# Patient Record
Sex: Female | Born: 1945 | Race: White | Hispanic: No | Marital: Married | Smoking: Never smoker
Health system: Southern US, Community
[De-identification: ages and names within clinical notes are randomized; demographics above are authoritative.]

## PROBLEM LIST (undated history)

## (undated) DIAGNOSIS — C801 Malignant (primary) neoplasm, unspecified: Secondary | ICD-10-CM

## (undated) DIAGNOSIS — J189 Pneumonia, unspecified organism: Secondary | ICD-10-CM

## (undated) DIAGNOSIS — J45909 Unspecified asthma, uncomplicated: Secondary | ICD-10-CM

## (undated) DIAGNOSIS — Z86718 Personal history of other venous thrombosis and embolism: Secondary | ICD-10-CM

## (undated) DIAGNOSIS — K219 Gastro-esophageal reflux disease without esophagitis: Secondary | ICD-10-CM

## (undated) DIAGNOSIS — T782XXA Anaphylactic shock, unspecified, initial encounter: Secondary | ICD-10-CM

## (undated) DIAGNOSIS — D649 Anemia, unspecified: Secondary | ICD-10-CM

## (undated) DIAGNOSIS — J301 Allergic rhinitis due to pollen: Secondary | ICD-10-CM

## (undated) DIAGNOSIS — H9313 Tinnitus, bilateral: Secondary | ICD-10-CM

## (undated) DIAGNOSIS — T8859XA Other complications of anesthesia, initial encounter: Secondary | ICD-10-CM

## (undated) DIAGNOSIS — N390 Urinary tract infection, site not specified: Secondary | ICD-10-CM

## (undated) HISTORY — PX: PORTACATH PLACEMENT: SHX2246

## (undated) HISTORY — PX: WISDOM TOOTH EXTRACTION: SHX21

## (undated) HISTORY — PX: OTHER SURGICAL HISTORY: SHX169

---

## 2020-04-12 DIAGNOSIS — U071 COVID-19: Secondary | ICD-10-CM

## 2020-04-12 HISTORY — DX: COVID-19: U07.1

## 2020-06-05 ENCOUNTER — Telehealth: Payer: Self-pay | Admitting: Oncology

## 2020-06-05 NOTE — Telephone Encounter (Signed)
Received a new pt referral from [facility] for bladder cancer. Alexandra Little has been cld and scheduled to see Dr. Alen Blew on 3/3 at 2pm. She's aware to arrive 30 minutes early. Pt was unable to provide her demographic information at the time of our phone call.

## 2020-06-11 ENCOUNTER — Ambulatory Visit: Payer: Self-pay | Admitting: Oncology

## 2020-06-12 ENCOUNTER — Telehealth: Payer: Self-pay | Admitting: Oncology

## 2020-06-12 ENCOUNTER — Inpatient Hospital Stay: Payer: Medicare Other | Attending: Oncology | Admitting: Oncology

## 2020-06-12 ENCOUNTER — Inpatient Hospital Stay (HOSPITAL_BASED_OUTPATIENT_CLINIC_OR_DEPARTMENT_OTHER): Payer: Medicare Other | Admitting: Oncology

## 2020-06-12 ENCOUNTER — Other Ambulatory Visit: Payer: Self-pay

## 2020-06-12 ENCOUNTER — Encounter: Payer: Self-pay | Admitting: Oncology

## 2020-06-12 VITALS — BP 113/63 | HR 83 | Temp 98.2°F | Resp 18

## 2020-06-12 DIAGNOSIS — Z7189 Other specified counseling: Secondary | ICD-10-CM

## 2020-06-12 DIAGNOSIS — Z7901 Long term (current) use of anticoagulants: Secondary | ICD-10-CM

## 2020-06-12 DIAGNOSIS — Z5112 Encounter for antineoplastic immunotherapy: Secondary | ICD-10-CM | POA: Insufficient documentation

## 2020-06-12 DIAGNOSIS — C679 Malignant neoplasm of bladder, unspecified: Secondary | ICD-10-CM | POA: Diagnosis not present

## 2020-06-12 DIAGNOSIS — Z86718 Personal history of other venous thrombosis and embolism: Secondary | ICD-10-CM

## 2020-06-12 DIAGNOSIS — Z79899 Other long term (current) drug therapy: Secondary | ICD-10-CM | POA: Insufficient documentation

## 2020-06-12 MED ORDER — LIDOCAINE-PRILOCAINE 2.5-2.5 % EX CREA: 1.0000 "application " | TOPICAL_CREAM | CUTANEOUS | 0 refills | Status: DC | PRN

## 2020-06-12 MED ORDER — PROCHLORPERAZINE MALEATE 10 MG PO TABS: 10.0000 mg | ORAL_TABLET | Freq: Four times a day (QID) | ORAL | 0 refills | Status: DC | PRN

## 2020-06-12 NOTE — Progress Notes (Signed)
Reason for the request:   Bladder cancer  HPI: I was asked by Dr. Dolores Patty  to evaluate Ms. Raatz with diagnosis of bladder cancer.  She is a 75 year old woman diagnosed with bladder cancer in October 2020.  At that time she presented with obstructive uropathy and pelvic adenopathy indicating advanced disease.  She had gross hematuria and a cystoscopy and a TURBT confirmed the diagnosis of urothelial carcinoma.  She underwent percutaneous nephrostomy tubes but continues to have borderline kidney function.  She was treated with carboplatin and gemcitabine with the last infusion in March 2021.  She had severe cytopenias after 5 cycles of therapy and was switched to Avelumab in April 2021.  In July 2021 showed worsening of disease occluding retroperitoneal and inguinal adenopathy.  He subsequently was switched to [facility] in September 2021 therapy was poorly tolerated at day 1, 8 and 15 at 28-day cycle.  He subsequently was switched to 1 mg/kg day 1 day 15 out of 28-day cycle.  She tolerated therapy very well since that time and the last infusion was given in February 2022.  CT scan in February 2022 showed a near complete response to therapy.  She relocated from [place] where she was getting her treatment currently living in a senior living facility with her husband with her close by.  Clinically, she reports feeling well without any complaints at this time.  She denies any nausea, vomiting or abdominal pain.  She denies any worsening neuropathy.   She does not report any headaches, blurry vision, syncope or seizures. Does not report any fevers, chills or sweats.  Does not report any cough, wheezing or hemoptysis.  Does not report any chest pain, palpitation, orthopnea or leg edema.  Does not report any nausea, vomiting or abdominal pain.  Does not report any constipation or diarrhea.  Does not report any skeletal complaints.    Does not report frequency, urgency or hematuria.  Does not report any skin rashes  or lesions. Does not report any heat or cold intolerance.  Does not report any lymphadenopathy or petechiae.  Does not report any anxiety or depression.  Remaining review of systems is negative.    Past medical history significant for deep vein thrombosis currently on Eliquis.    Medications including Eliquis.    No family history of malignancy.  Social History   Socioeconomic History  . Marital status: Not on file    Spouse name: Not on file  . Number of children: Not on file  . Years of education: Not on file  . Highest education level: Not on file  Occupational History  . Not on file  Tobacco Use  . Smoking status: Not on file  . Smokeless tobacco: Not on file  Substance and Sexual Activity  . Alcohol use: Not on file  . Drug use: Not on file  . Sexual activity: Not on file  Other Topics Concern  . Not on file  Social History Narrative  . Not on file   Social Determinants of Health   Financial Resource Strain: Not on file  Food Insecurity: Not on file  Transportation Needs: Not on file  Physical Activity: Not on file  Stress: Not on file  Social Connections: Not on file  Intimate Partner Violence: Not on file  :  Pertinent items are noted in HPI.  Exam: Blood pressure 113/63, pulse 83, temperature 98.2 F (36.8 C), temperature source Oral, resp. rate 18, SpO2 100 %.  ECOG one  General appearance: alert  and cooperative appeared without distress. Head: atraumatic without any abnormalities. Eyes: conjunctivae/corneas clear. PERRL.  Sclera anicteric. Throat: lips, mucosa, and tongue normal; without oral thrush or ulcers. Resp: clear to auscultation bilaterally without rhonchi, wheezes or dullness to percussion. Cardio: regular rate and rhythm, S1, S2 normal, no murmur, click, rub or gallop GI: soft, non-tender; bowel sounds normal; no masses,  no organomegaly Skin: Skin color, texture, turgor normal. No rashes or lesions Lymph nodes: Cervical,  supraclavicular, and axillary nodes normal. Neurologic: Grossly normal without any motor, sensory or deep tendon reflexes. Musculoskeletal: No joint deformity or effusion.    Assessment and Plan:    75 year old woman with:  1.  Advanced bladder cancer diagnosed in December 2020.  She presented with hematuria and found to have pelvic adenopathy and obstructive uropathy.  She was treated with carboplatin and gemcitabine with reasonable response but severe cytopenia.  She was switched to Avelumab with progression of disease in September 2021.  She has been on Padcev at this time with better tolerance at 1 mg/kg day 1, 8  of a 28-day cycle.  She is relocated from [place] to be close with her daughter and here to establish care.  The natural course of her disease was reviewed today and treatment options were discussed.  Given her excellent response to Padcevl and overall excellent tolerance I have recommended continuing with this plan.  The plan is to proceed with Padcev 1 mg/kg day every other week out of a 28-day cycle.  Complication associated with this treatment were reiterated including nausea, vomiting, myelosuppression, neuropathy and hyperglycemia.  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  2.  IV access: Port-A-Cath in place and will obtain a chest x-ray to assess position.  3  Antiemetics: Prescription for Compazine will be available to her.  4.  Prognosis and goals of care: Treatment is palliative but her formal status is reasonable and aggressive measures are warranted.  5.  Follow-up: Will be in the near future to start therapy.   60  minutes were dedicated to this visit. The time was spent on reviewing laboratory data, imaging studies, discussing treatment options, and answering questions regarding future plan.     A copy of this consult has been forwarded to the requesting physician.

## 2020-06-12 NOTE — Telephone Encounter (Signed)
Ms. Peterkin no showed for her appt today w/Dr. Alen Blew. I rescheduled the pt to see Dr. Alen Blew on 3/15 at 11am. I cld and lft the appt date and time on her vm.

## 2020-06-12 NOTE — Progress Notes (Unsigned)
  Reason for the request:    Bladder cancer  HPI: I was asked by Dr. Dolores Patty to evaluate Alexandra Little for the evaluation of a bladder cancer.   does not report any headaches, blurry vision, syncope or seizures. Does not report any fevers, chills or sweats.  Does not report any cough, wheezing or hemoptysis.  Does not report any chest pain, palpitation, orthopnea or leg edema.  Does not report any nausea, vomiting or abdominal pain.  Does not report any constipation or diarrhea.  Does not report any skeletal complaints.    Does not report frequency, urgency or hematuria.  Does not report any skin rashes or lesions. Does not report any heat or cold intolerance.  Does not report any lymphadenopathy or petechiae.  Does not report any anxiety or depression.  Remaining review of systems is negative.    No past medical history on file.:  *** The histories are not reviewed yet. Please review them in the "History" navigator section and refresh this SmartLink.:  No current outpatient medications on file.:  Not on File:  No family history on file.:  Social History   Socioeconomic History  . Marital status: Not on file    Spouse name: Not on file  . Number of children: Not on file  . Years of education: Not on file  . Highest education level: Not on file  Occupational History  . Not on file  Tobacco Use  . Smoking status: Not on file  . Smokeless tobacco: Not on file  Substance and Sexual Activity  . Alcohol use: Not on file  . Drug use: Not on file  . Sexual activity: Not on file  Other Topics Concern  . Not on file  Social History Narrative  . Not on file   Social Determinants of Health   Financial Resource Strain: Not on file  Food Insecurity: Not on file  Transportation Needs: Not on file  Physical Activity: Not on file  Stress: Not on file  Social Connections: Not on file  Intimate Partner Violence: Not on file  :  Pertinent items are noted in HPI.  Exam: There were no  vitals taken for this visit. General appearance: alert and cooperative appeared without distress. Head: atraumatic without any abnormalities. Eyes: conjunctivae/corneas clear. PERRL.  Sclera anicteric. Throat: lips, mucosa, and tongue normal; without oral thrush or ulcers. Resp: clear to auscultation bilaterally without rhonchi, wheezes or dullness to percussion. Cardio: regular rate and rhythm, S1, S2 normal, no murmur, click, rub or gallop GI: soft, non-tender; bowel sounds normal; no masses,  no organomegaly Skin: Skin color, texture, turgor normal. No rashes or lesions Lymph nodes: Cervical, supraclavicular, and axillary nodes normal. Neurologic: Grossly normal without any motor, sensory or deep tendon reflexes. Musculoskeletal: No joint deformity or effusion.  No results for input(s): WBC, HGB, HCT, PLT in the last 72 hours. No results for input(s): NA, K, CL, CO2, GLUCOSE, BUN, CREATININE, CALCIUM in the last 72 hours.   Blood smear review: ***  Pathology:***  No results found.  Assessment and Plan:       Thank you for the referral.  I had the pleasure of meeting this patient today.  A copy of this consult has been forwarded to the requesting physician.

## 2020-06-12 NOTE — Progress Notes (Signed)
START ON PATHWAY REGIMEN - Bladder     A cycle is every 28 days:     Enfortumab vedotin-ejfv   **Always confirm dose/schedule in your pharmacy ordering system**  Patient Characteristics: Advanced/Metastatic Disease, Third Line and Beyond Therapeutic Status: Advanced/Metastatic Disease Line of Therapy: Third Line and Beyond  Intent of Therapy: Non-Curative / Palliative Intent, Discussed with Patient

## 2020-06-12 NOTE — Telephone Encounter (Signed)
Pt came to today's appt late after it was marked as a no show. I re-entered her appt at 3pm because I was unable to re-enter in the 2pm time slot. I notified Dr. Hazeline Junker nurse.

## 2020-06-13 ENCOUNTER — Telehealth: Payer: Self-pay | Admitting: Oncology

## 2020-06-13 NOTE — Telephone Encounter (Signed)
Scheduled per 03/03 los, patient has been called and voicemail was left. ?

## 2020-06-18 ENCOUNTER — Inpatient Hospital Stay: Payer: Medicare Other

## 2020-06-19 ENCOUNTER — Other Ambulatory Visit: Payer: Self-pay | Admitting: Urology

## 2020-06-19 ENCOUNTER — Other Ambulatory Visit: Payer: Self-pay | Admitting: Oncology

## 2020-06-19 ENCOUNTER — Other Ambulatory Visit: Payer: Self-pay

## 2020-06-19 ENCOUNTER — Inpatient Hospital Stay: Payer: Medicare Other

## 2020-06-19 ENCOUNTER — Telehealth: Payer: Self-pay

## 2020-06-19 ENCOUNTER — Ambulatory Visit (HOSPITAL_BASED_OUTPATIENT_CLINIC_OR_DEPARTMENT_OTHER)
Admission: RE | Admit: 2020-06-19 | Discharge: 2020-06-19 | Disposition: A | Discharge status: Home / Self Care | Payer: Medicare Other | Source: Ambulatory Visit | Attending: Oncology | Admitting: Oncology

## 2020-06-19 VITALS — BP 137/96 | HR 99 | Temp 97.8°F | Resp 18 | Wt 170.5 lb

## 2020-06-19 DIAGNOSIS — C679 Malignant neoplasm of bladder, unspecified: Secondary | ICD-10-CM

## 2020-06-19 DIAGNOSIS — Z79899 Other long term (current) drug therapy: Secondary | ICD-10-CM | POA: Diagnosis not present

## 2020-06-19 DIAGNOSIS — Z5112 Encounter for antineoplastic immunotherapy: Secondary | ICD-10-CM | POA: Diagnosis not present

## 2020-06-19 DIAGNOSIS — Z95828 Presence of other vascular implants and grafts: Secondary | ICD-10-CM | POA: Insufficient documentation

## 2020-06-19 LAB — CMP (CANCER CENTER ONLY)
ALT: <6 U/L (ref 0–44)
AST: 14 U/L — ABNORMAL LOW (ref 15–41)
Albumin: 2.8 g/dL — ABNORMAL LOW (ref 3.5–5.0)
Alkaline Phosphatase: 113 U/L (ref 38–126)
Anion gap: 9 (ref 5–15)
BUN: 34 mg/dL — ABNORMAL HIGH (ref 8–23)
CO2: 20 mmol/L — ABNORMAL LOW (ref 22–32)
Calcium: 9.5 mg/dL (ref 8.9–10.3)
Chloride: 111 mmol/L (ref 98–111)
Creatinine: 3.49 mg/dL (ref 0.44–1.00)
GFR, Estimated: 13 mL/min — ABNORMAL LOW (ref >60)
Glucose, Bld: 102 mg/dL — ABNORMAL HIGH (ref 70–99)
Potassium: 5.1 mmol/L (ref 3.5–5.1)
Sodium: 140 mmol/L (ref 135–145)
Total Bilirubin: 0.4 mg/dL (ref 0.3–1.2)
Total Protein: 7.9 g/dL (ref 6.5–8.1)

## 2020-06-19 LAB — CBC WITH DIFFERENTIAL (CANCER CENTER ONLY)
Abs Immature Granulocytes: 0.12 10*3/uL — ABNORMAL HIGH (ref 0.00–0.07)
Basophils Absolute: 0.1 10*3/uL (ref 0.0–0.1)
Basophils Relative: 1 %
Eosinophils Absolute: 0.4 10*3/uL (ref 0.0–0.5)
Eosinophils Relative: 5 %
HCT: 34.9 % — ABNORMAL LOW (ref 36.0–46.0)
Hemoglobin: 11.2 g/dL — ABNORMAL LOW (ref 12.0–15.0)
Immature Granulocytes: 1 %
Lymphocytes Relative: 15 %
Lymphs Abs: 1.3 10*3/uL (ref 0.7–4.0)
MCH: 29.1 pg (ref 26.0–34.0)
MCHC: 32.1 g/dL (ref 30.0–36.0)
MCV: 90.6 fL (ref 80.0–100.0)
Monocytes Absolute: 0.8 10*3/uL (ref 0.1–1.0)
Monocytes Relative: 9 %
Neutro Abs: 6.2 10*3/uL (ref 1.7–7.7)
Neutrophils Relative %: 69 %
Platelet Count: 285 10*3/uL (ref 150–400)
RBC: 3.85 MIL/uL — ABNORMAL LOW (ref 3.87–5.11)
RDW: 14.5 % (ref 11.5–15.5)
WBC Count: 8.9 10*3/uL (ref 4.0–10.5)
nRBC: 0 % (ref 0.0–0.2)

## 2020-06-19 MED ORDER — SODIUM CHLORIDE 0.9% FLUSH
10.0000 mL | Freq: Once | INTRAVENOUS | Status: AC
  Administered 2020-06-19: 10 mL
  Filled 2020-06-19: qty 10

## 2020-06-19 MED ORDER — SODIUM CHLORIDE 0.9 % IV SOLN
10.0000 mg | Freq: Once | INTRAVENOUS | Status: AC
  Administered 2020-06-19: 10 mg via INTRAVENOUS
  Filled 2020-06-19: qty 10

## 2020-06-19 MED ORDER — PALONOSETRON HCL INJECTION 0.25 MG/5ML
0.2500 mg | Freq: Once | INTRAVENOUS | Status: AC
  Administered 2020-06-19: 0.25 mg via INTRAVENOUS

## 2020-06-19 MED ORDER — SODIUM CHLORIDE 0.9 % IV SOLN
1.0000 mg/kg | Freq: Once | INTRAVENOUS | Status: AC
  Administered 2020-06-19: 80 mg via INTRAVENOUS
  Filled 2020-06-19: qty 6

## 2020-06-19 MED ORDER — SODIUM CHLORIDE 0.9 % IV SOLN
Freq: Once | INTRAVENOUS | Status: AC
  Filled 2020-06-19: qty 250

## 2020-06-19 MED ORDER — PALONOSETRON HCL INJECTION 0.25 MG/5ML
INTRAVENOUS | Status: AC
  Filled 2020-06-19: qty 5

## 2020-06-19 MED ORDER — SODIUM CHLORIDE 0.9% FLUSH
10.0000 mL | INTRAVENOUS | Status: DC | PRN
  Administered 2020-06-19: 10 mL
  Filled 2020-06-19: qty 10

## 2020-06-19 MED ORDER — HEPARIN SOD (PORK) LOCK FLUSH 100 UNIT/ML IV SOLN
500.0000 [IU] | Freq: Once | INTRAVENOUS | Status: AC | PRN
  Administered 2020-06-19: 500 [IU]
  Filled 2020-06-19: qty 5

## 2020-06-19 NOTE — Telephone Encounter (Signed)
CRITICAL VALUE STICKER  CRITICAL VALUE: Creatinine 3.49  RECEIVER (on-site recipient of call): Evette Georges, [place] NOTIFIED: 06/19/20 9:12 am   MESSENGER (representative from lab): Rosann Auerbach  MD NOTIFIED: Dr. Alen Blew  TIME OF NOTIFICATION: 9:15 am  RESPONSE: Critical lab given to Alfonse Flavors, RN for further instructions from Dr. Alen Blew.

## 2020-06-19 NOTE — Progress Notes (Signed)
Per Dr. Alen Blew, ok to treat with creat 3.49  Pt recently moved to [place] from [facility]. Has received Padcev treatment since October, 2021, tolerated well with no adverse reactions.

## 2020-06-19 NOTE — H&P (Signed)
Office Visit Report     06/18/2020   --------------------------------------------------------------------------------   Alexandra Little  MRN: [id number]  DOB: 01-Aug-1945, 75 year old Female  SSN:    PRIMARY CARE:  Alexandra Lass, MD  REFERRING:  Alexandra Lass, MD  PROVIDER:  Raynelle Little, M.D.  LOCATION:  [facility] [phone]     --------------------------------------------------------------------------------   CC/HPI: 1. Metastatic urothelial carcinoma the bladder  2. Bilateral ureteral obstruction   Alexandra Little is a 75 year old female with metastatic urothelial carcinoma of the bladder. She recently relocated from [place] to [place] to be closer to her daughter, Alexandra Little. She was treated with systemic therapy. Due to her chronic kidney disease, she was not a candidate for cisplatin chemotherapy and was treated with carboplatin and gemcitabine. She did not tolerate this well and has subsequently been on multiple immunotherapy ease. Most recently, she is on a alternate dose of Padcev and has been doing fairly well with this. She has had a complete response based on imaging and continues on this therapy and has established care with Alexandra Little. In October of 2020 when she was diagnosed, she was also noted to have ureteral obstruction and underwent bilateral nephrostomy tube placement. She recently had internalization of bilateral ureteral stents and removal of her nephrostomy tubes. She seems to have tolerated this quite well. Her serum creatinine a few weeks ago was 1.4 and this was felt to be very consistent with her baseline possibly better than her baseline which has hovered around 1.6. She has been tolerating her stents relatively well. She does have chronic urinary incontinence and uses approximately 4 pads per day. However, this is not significantly worse since her ureteral stents were placed. She describes her incontinence as unconscious. It is unclear whether she may  have some stress related incontinence and urge related incontinence. She is unable to provide an accurate history in this regard. She does have some significant vaginal in skin irritation related to her pads and incontinence.     ALLERGIES:  Latex Lisinopril Salmon    MEDICATIONS: Eliquis 5 mg tablet     GU PSH: No GU PSH    NON-GU PSH: No Non-GU PSH    GU PMH: History of bladder cancer Kidney Failure Unspec    NON-GU PMH: Asthma DVT, History    FAMILY HISTORY: 1 Daughter - Runs in Family   SOCIAL HISTORY: Marital Status: Unknown Preferred Language: English; Ethnicity: Not Hispanic Or Latino; Race: White Current Smoking Status: Patient has never smoked.   Tobacco Use Assessment Completed: Used Tobacco in last 30 days? Does drink.  Drinks 2 caffeinated drinks per day.    REVIEW OF SYSTEMS:    GU Review Female:   Patient reports frequent urination, hard to postpone urination, burning /pain with urination, get up at night to urinate, and trouble starting your stream. Patient denies leakage of urine, stream starts and stops, have to strain to urinate, and currently pregnant.  Gastrointestinal (Upper):   Patient denies nausea and vomiting.  Gastrointestinal (Lower):   Patient denies diarrhea and constipation.  Constitutional:   Patient reports weight loss and fatigue. Patient denies fever and night sweats.  Skin:   Patient reports skin rash/ lesion and itching.   Eyes:   Patient denies blurred vision and double vision.  Ears/ Nose/ Throat:   Patient denies sore throat and sinus problems.  Hematologic/Lymphatic:   Patient denies swollen glands and easy bruising.  Cardiovascular:   Patient denies leg swelling and  chest pains.  Respiratory:   Patient denies cough and shortness of breath.  Endocrine:   Patient denies excessive thirst.  Musculoskeletal:   Patient denies back pain and joint pain.  Neurological:   Patient denies headaches and dizziness.  Psychologic:   Patient  denies depression and anxiety.   VITAL SIGNS:      06/18/2020 01:01 PM  Weight 167 lb / 75.75 kg  Height 65 in / 165.1 cm  BP 115/68 mmHg  Pulse 102 /min  Temperature 97.3 F / 36.2 C  BMI 27.8 kg/m   GU PHYSICAL EXAMINATION:      Notes: She does have significant erythema of her labia and surrounding skin extending up into her inguinal regions bilaterally.   MULTI-SYSTEM PHYSICAL EXAMINATION:    Constitutional: Well-nourished. No physical deformities. Normally developed. Good grooming.  Respiratory: No labored breathing, no use of accessory muscles.   Cardiovascular: Normal temperature, normal extremity pulses, no swelling, no varicosities.  Gastrointestinal: No mass, no tenderness, no rigidity, non obese abdomen. Her nephrostomy tube sites are well healed. No CVA tenderness.     PAST DATA REVIEW: None   PROCEDURES:          Urinalysis w/Scope Dipstick Dipstick Cont'd Micro  Color: Straw Bilirubin: Neg mg/dL WBC/hpf: >60/hpf  Appearance: Turbid Ketones: Neg mg/dL RBC/hpf: 10 - 20/hpf  Specific Gravity: 1.020 Blood: 3+ ery/uL Bacteria: Few (10-25/hpf)  pH: 6.5 Protein: 2+ mg/dL Cystals: NS (Not Seen)  Glucose: Neg mg/dL Urobilinogen: 0.2 mg/dL Casts: NS (Not Seen)    Nitrites: Neg Trichomonas: Not Present    Leukocyte Esterase: 3+ leu/uL Mucous: Present      Epithelial Cells: NS (Not Seen)      Yeast: NS (Not Seen)      Sperm: Not Present    Notes: microscopic not concentrated    ASSESSMENT:      ICD-10 Details  1 GU:   Bladder Cancer overlapping sites - C67.8   2   Ureteral obstruction - N13.1    PLAN:            Medications New Meds: Nystatin 100,000 unit/gram powder Apply to affected area tid for up to 2 weeks   #30  0 Refill(s)            Orders Labs Urine Culture, BMP          Schedule Return Visit/Planned Activity: Other See Visit Notes             Note: Will call to schedule surgery.          Document Letter(s):  Created for Patient: Clinical  Summary         Notes:   1. Metastatic urothelial carcinoma the bladder: She will continue systemic therapy under the care of Alexandra Little. Fortunately, she has had an excellent response to current therapy.   2. Bilateral ureteral obstruction: Her renal function will be checked today. Assuming this is stable, she will then be scheduled to proceed with bilateral ureteral stent change in mid to late May. Her urine will be cultured today to assess for appropriate perioperative antibiotic therapy.   3. Incontinence/ skin changes: She will be prescribed nystatin powder for a possible fungal infection. We discussed having her try alternate pads and to use zinc oxide is a skin barrier for preventative measures in the future. She also has been provided samples for Gemtesa 75 mg. she was instructed on proper use and potential side effects and will notify me if this is  helpful.   Cc: Dr. Kathyrn Little  Dr. Zola Button    * Signed by Alexandra Little, M.D. on 06/18/20 at 5:52 PM (EST)*       APPENDED NOTES:  Cr 3.49 today. Renal ultrasound with bilateral hydronephrosis. Will proceed with bilateral ureteral stents and plan to upsize stents and recheck Cr in a few days.     * Signed by Alexandra Little, M.D. on 06/19/20 at 7:55 PM (EST)*

## 2020-06-19 NOTE — Progress Notes (Signed)
Scheduled for Renal US at [facility] for 2:00PM, patient aware.

## 2020-06-19 NOTE — Patient Instructions (Signed)
[facility] Discharge Instructions for Patients Receiving Chemotherapy  Today you received the following chemotherapy agents padcev  To help prevent nausea and vomiting after your treatment, we encourage you to take your nausea medication as directed   If you develop nausea and vomiting that is not controlled by your nausea medication, call the clinic.   BELOW ARE SYMPTOMS THAT SHOULD BE REPORTED IMMEDIATELY:  *FEVER GREATER THAN 100.5 F  *CHILLS WITH OR WITHOUT FEVER  NAUSEA AND VOMITING THAT IS NOT CONTROLLED WITH YOUR NAUSEA MEDICATION  *UNUSUAL SHORTNESS OF BREATH  *UNUSUAL BRUISING OR BLEEDING  TENDERNESS IN MOUTH AND THROAT WITH OR WITHOUT PRESENCE OF ULCERS  *URINARY PROBLEMS  *BOWEL PROBLEMS  UNUSUAL RASH Items with * indicate a potential emergency and should be followed up as soon as possible.  Feel free to call the clinic should you have any questions or concerns. The clinic phone number is [phone].  Please show the [facility] at check-in to the Emergency Department and triage nurse.

## 2020-06-20 ENCOUNTER — Encounter (HOSPITAL_COMMUNITY)
Admission: RE | Disposition: A | Discharge status: Home / Self Care | Payer: Self-pay | Source: Home / Self Care | Attending: Urology

## 2020-06-20 ENCOUNTER — Ambulatory Visit (HOSPITAL_COMMUNITY)
Admission: RE | Admit: 2020-06-20 | Discharge: 2020-06-20 | Disposition: A | Discharge status: Home / Self Care | Payer: Medicare Other | Attending: Urology | Admitting: Urology

## 2020-06-20 ENCOUNTER — Inpatient Hospital Stay (HOSPITAL_COMMUNITY): Payer: Medicare Other | Admitting: Certified Registered Nurse Anesthetist

## 2020-06-20 ENCOUNTER — Inpatient Hospital Stay (HOSPITAL_COMMUNITY): Payer: Medicare Other

## 2020-06-20 ENCOUNTER — Encounter (HOSPITAL_COMMUNITY): Payer: Self-pay | Admitting: Urology

## 2020-06-20 DIAGNOSIS — Z888 Allergy status to other drugs, medicaments and biological substances status: Secondary | ICD-10-CM | POA: Insufficient documentation

## 2020-06-20 DIAGNOSIS — N189 Chronic kidney disease, unspecified: Secondary | ICD-10-CM | POA: Diagnosis not present

## 2020-06-20 DIAGNOSIS — Z91040 Latex allergy status: Secondary | ICD-10-CM | POA: Insufficient documentation

## 2020-06-20 DIAGNOSIS — N131 Hydronephrosis with ureteral stricture, not elsewhere classified: Secondary | ICD-10-CM | POA: Insufficient documentation

## 2020-06-20 DIAGNOSIS — R32 Unspecified urinary incontinence: Secondary | ICD-10-CM | POA: Insufficient documentation

## 2020-06-20 DIAGNOSIS — Z86718 Personal history of other venous thrombosis and embolism: Secondary | ICD-10-CM | POA: Diagnosis not present

## 2020-06-20 DIAGNOSIS — C799 Secondary malignant neoplasm of unspecified site: Secondary | ICD-10-CM | POA: Diagnosis not present

## 2020-06-20 DIAGNOSIS — Z91013 Allergy to seafood: Secondary | ICD-10-CM | POA: Diagnosis not present

## 2020-06-20 DIAGNOSIS — C678 Malignant neoplasm of overlapping sites of bladder: Secondary | ICD-10-CM | POA: Insufficient documentation

## 2020-06-20 DIAGNOSIS — Z9221 Personal history of antineoplastic chemotherapy: Secondary | ICD-10-CM | POA: Diagnosis not present

## 2020-06-20 DIAGNOSIS — L988 Other specified disorders of the skin and subcutaneous tissue: Secondary | ICD-10-CM | POA: Insufficient documentation

## 2020-06-20 HISTORY — DX: Unspecified asthma, uncomplicated: J45.909

## 2020-06-20 HISTORY — DX: Anaphylactic shock, unspecified, initial encounter: T78.2XXA

## 2020-06-20 HISTORY — PX: CYSTOSCOPY WITH RETROGRADE PYELOGRAM, URETEROSCOPY AND STENT PLACEMENT: SHX5789

## 2020-06-20 HISTORY — DX: Gastro-esophageal reflux disease without esophagitis: K21.9

## 2020-06-20 HISTORY — DX: Tinnitus, bilateral: H93.13

## 2020-06-20 HISTORY — DX: Malignant (primary) neoplasm, unspecified: C80.1

## 2020-06-20 HISTORY — DX: Urinary tract infection, site not specified: N39.0

## 2020-06-20 HISTORY — DX: Personal history of other venous thrombosis and embolism: Z86.718

## 2020-06-20 HISTORY — DX: Allergic rhinitis due to pollen: J30.1

## 2020-06-20 LAB — BASIC METABOLIC PANEL
Anion gap: 10 (ref 5–15)
BUN: 37 mg/dL — ABNORMAL HIGH (ref 8–23)
CO2: 19 mmol/L — ABNORMAL LOW (ref 22–32)
Calcium: 9.4 mg/dL (ref 8.9–10.3)
Chloride: 110 mmol/L (ref 98–111)
Creatinine, Ser: 2.81 mg/dL — ABNORMAL HIGH (ref 0.44–1.00)
GFR, Estimated: 17 mL/min — ABNORMAL LOW (ref >60)
Glucose, Bld: 114 mg/dL — ABNORMAL HIGH (ref 70–99)
Potassium: 4.9 mmol/L (ref 3.5–5.1)
Sodium: 139 mmol/L (ref 135–145)

## 2020-06-20 SURGERY — CYSTOURETEROSCOPY, WITH RETROGRADE PYELOGRAM AND STENT INSERTION
Anesthesia: General | Laterality: Bilateral

## 2020-06-20 MED ORDER — LIDOCAINE 2% (20 MG/ML) 5 ML SYRINGE
INTRAMUSCULAR | Status: DC | PRN
  Administered 2020-06-20: 60 mg via INTRAVENOUS

## 2020-06-20 MED ORDER — PROPOFOL 10 MG/ML IV BOLUS
INTRAVENOUS | Status: AC
  Filled 2020-06-20: qty 20

## 2020-06-20 MED ORDER — OXYCODONE HCL 5 MG/5ML PO SOLN: 5.0000 mg | Freq: Once | ORAL | Status: AC | PRN

## 2020-06-20 MED ORDER — IOHEXOL 300 MG/ML  SOLN
INTRAMUSCULAR | Status: DC | PRN
  Administered 2020-06-20: 9 mL via URETHRAL

## 2020-06-20 MED ORDER — SODIUM CHLORIDE 0.9 % IR SOLN
Status: DC | PRN
  Administered 2020-06-20 (×2): 3000 mL

## 2020-06-20 MED ORDER — LACTATED RINGERS IV SOLN: INTRAVENOUS | Status: DC

## 2020-06-20 MED ORDER — EPHEDRINE SULFATE-NACL 50-0.9 MG/10ML-% IV SOSY
PREFILLED_SYRINGE | INTRAVENOUS | Status: DC | PRN
  Administered 2020-06-20 (×2): 5 mg via INTRAVENOUS

## 2020-06-20 MED ORDER — ONDANSETRON HCL 4 MG/2ML IJ SOLN
INTRAMUSCULAR | Status: DC | PRN
  Administered 2020-06-20: 4 mg via INTRAVENOUS

## 2020-06-20 MED ORDER — FENTANYL CITRATE (PF) 100 MCG/2ML IJ SOLN
INTRAMUSCULAR | Status: DC | PRN
  Administered 2020-06-20 (×2): 25 ug via INTRAVENOUS

## 2020-06-20 MED ORDER — OXYCODONE HCL 5 MG PO TABS
5.0000 mg | ORAL_TABLET | Freq: Once | ORAL | Status: AC | PRN
  Administered 2020-06-20: 5 mg via ORAL

## 2020-06-20 MED ORDER — SODIUM CHLORIDE 0.9 % IV SOLN
2.0000 g | Freq: Once | INTRAVENOUS | Status: AC
  Administered 2020-06-20: 2 g via INTRAVENOUS
  Filled 2020-06-20: qty 20

## 2020-06-20 MED ORDER — PROPOFOL 10 MG/ML IV BOLUS
INTRAVENOUS | Status: DC | PRN
  Administered 2020-06-20: 200 mg via INTRAVENOUS

## 2020-06-20 MED ORDER — OXYCODONE HCL 5 MG PO TABS
ORAL_TABLET | ORAL | Status: AC
  Filled 2020-06-20: qty 1

## 2020-06-20 MED ORDER — LIDOCAINE 2% (20 MG/ML) 5 ML SYRINGE
INTRAMUSCULAR | Status: AC
  Filled 2020-06-20: qty 5

## 2020-06-20 MED ORDER — HYDROXYZINE HCL 10 MG PO TABS: 10.0000 mg | ORAL_TABLET | Freq: Three times a day (TID) | ORAL | 0 refills | Status: DC | PRN

## 2020-06-20 MED ORDER — FENTANYL CITRATE (PF) 100 MCG/2ML IJ SOLN: 25.0000 ug | INTRAMUSCULAR | Status: DC | PRN

## 2020-06-20 MED ORDER — ACETAMINOPHEN 10 MG/ML IV SOLN: 1000.0000 mg | Freq: Once | INTRAVENOUS | Status: DC

## 2020-06-20 MED ORDER — FENTANYL CITRATE (PF) 100 MCG/2ML IJ SOLN
INTRAMUSCULAR | Status: AC
  Filled 2020-06-20: qty 2

## 2020-06-20 MED ORDER — ONDANSETRON HCL 4 MG/2ML IJ SOLN: 4.0000 mg | Freq: Once | INTRAMUSCULAR | Status: DC | PRN

## 2020-06-20 MED ORDER — ONDANSETRON HCL 4 MG/2ML IJ SOLN
INTRAMUSCULAR | Status: AC
  Filled 2020-06-20: qty 2

## 2020-06-20 MED ORDER — EPHEDRINE 5 MG/ML INJ
INTRAVENOUS | Status: AC
  Filled 2020-06-20: qty 10

## 2020-06-20 SURGICAL SUPPLY — 20 items
BAG URO CATCHER STRL LF (MISCELLANEOUS) ×2 IMPLANT
BASKET ZERO TIP NITINOL 2.4FR (BASKET) IMPLANT
CATH INTERMIT  6FR 70CM (CATHETERS) IMPLANT
CLOTH BEACON ORANGE TIMEOUT ST (SAFETY) ×2 IMPLANT
GLOVE SURG ENC TEXT LTX SZ7.5 (GLOVE) ×2 IMPLANT
GOWN STRL REUS W/TWL LRG LVL3 (GOWN DISPOSABLE) ×2 IMPLANT
GUIDEWIRE STR DUAL SENSOR (WIRE) ×2 IMPLANT
GUIDEWIRE ZIPWRE .038 STRAIGHT (WIRE) IMPLANT
IV NS 1000ML (IV SOLUTION) ×1
IV NS 1000ML BAXH (IV SOLUTION) ×1 IMPLANT
KIT TURNOVER KIT A (KITS) ×2 IMPLANT
LASER FIB FLEXIVA PULSE ID 365 (Laser) IMPLANT
MANIFOLD NEPTUNE II (INSTRUMENTS) ×2 IMPLANT
PACK CYSTO (CUSTOM PROCEDURE TRAY) ×2 IMPLANT
SHEATH URETERAL 12FRX35CM (MISCELLANEOUS) IMPLANT
STENT CONTOUR 8FR X 24 (STENTS) ×4 IMPLANT
TRACTIP FLEXIVA PULS ID 200XHI (Laser) IMPLANT
TRACTIP FLEXIVA PULSE ID 200 (Laser)
TUBING CONNECTING 10 (TUBING) ×2 IMPLANT
TUBING UROLOGY SET (TUBING) ×2 IMPLANT

## 2020-06-20 NOTE — Interval H&P Note (Signed)
History and Physical Interval Note:  06/20/2020 11:34 AM  Alexandra Little  has presented today for surgery, with the diagnosis of bladder cancer.  The various methods of treatment have been discussed with the patient and family. After consideration of risks, benefits and other options for treatment, the patient has consented to  Procedure(s): CYSTOSCOPY WITH RETROGRADE PYELOGRAM, URETEROSCOPY AND STENT PLACEMENT (Bilateral) as a surgical intervention.  The patient's history has been reviewed, patient examined, no change in status, stable for surgery.  I have reviewed the patient's chart and labs.  Questions were answered to the patient's satisfaction.     [employer]

## 2020-06-20 NOTE — Transfer of Care (Signed)
Immediate Anesthesia Transfer of Care Note  Patient: Alexandra Little  Procedure(s) Performed: CYSTOSCOPY WITH RETROGRADE PYELOGRAM, URETEROSCOPY AND STENT PLACEMENT (Bilateral )  Patient Location: PACU  Anesthesia Type:General  Level of Consciousness: awake, alert  and oriented  Airway & Oxygen Therapy: Patient Spontanous Breathing and Patient connected to face mask oxygen  Post-op Assessment: Report given to RN and Post -op Vital signs reviewed and stable  Post vital signs: Reviewed and stable  Last Vitals:  Vitals Value Taken Time  BP 135/98 06/20/20 1209  Temp    Pulse 80 06/20/20 1210  Resp 17 06/20/20 1210  SpO2 100 % 06/20/20 1210  Vitals shown include unvalidated device data.  Last Pain:  Vitals:   06/20/20 0901  PainSc: 0-No pain         Complications: No complications documented.

## 2020-06-20 NOTE — Anesthesia Postprocedure Evaluation (Signed)
Anesthesia Post Note  Patient: Alexandra Little  Procedure(s) Performed: CYSTOSCOPY WITH RETROGRADE PYELOGRAM, URETEROSCOPY AND STENT PLACEMENT (Bilateral )     Patient location during evaluation: PACU Anesthesia Type: General Level of consciousness: awake Pain management: pain level controlled Vital Signs Assessment: post-procedure vital signs reviewed and stable Respiratory status: spontaneous breathing, nonlabored ventilation, respiratory function stable and patient connected to nasal cannula oxygen Cardiovascular status: blood pressure returned to baseline and stable Postop Assessment: no apparent nausea or vomiting Anesthetic complications: no   No complications documented.  Last Vitals:  Vitals:   06/20/20 1230 06/20/20 1253  BP: 120/64 135/66  Pulse: 71 66  Resp: 13 12  Temp: (!) 36.3 C (!) 36.3 C  SpO2: 100% 100%    Last Pain:  Vitals:   06/20/20 1253  PainSc: 4                  Roselia Snipe P Torryn Fiske

## 2020-06-20 NOTE — Op Note (Signed)
Preoperative diagnosis:  1. Metastatic urothelial cancer 2. Bilateral ureteral obstruction  Postoperative diagnosis:  1. Metastatic urothelial cancer 2. Bilateral ureteral obstruction  Procedure:  1. Cystoscopy 2. Bilateral ureteral stent placement (8 x 24 -no string) 3. Bilateral retrograde pyelography with interpretation  Surgeon: Pryor Curia. M.D.  Anesthesia: General  Complications: None  Intraoperative findings: Bilateral retrograde pyelograms were performed with Omnipaque contrast and a 6 French ureteral catheter.  These demonstrated bilateral hydronephrosis without intrarenal filling defects.  Both indwelling ureteral stents appeared to be 6 [place] stents and appeared to be significantly covered with debris.  EBL: Minimal  Specimens: None  Indication: Alexandra Little is a 75 y.o. patient with bilateral ureteral obstruction related to metastatic urothelial cancer of the bladder.  Recently, her serum creatinine was noted to be increasing and renal ultrasonography demonstrated bilateral hydronephrosis indicating possible failure of her ureteral stents.  After reviewing the management options for treatment, he elected to proceed with the above surgical procedure(s). We have discussed the potential benefits and risks of the procedure, side effects of the proposed treatment, the likelihood of the patient achieving the goals of the procedure, and any potential problems that might occur during the procedure or recuperation. Informed consent has been obtained.  Description of procedure:  The patient was taken to the operating room and general anesthesia was induced.  The patient was placed in the dorsal lithotomy position, prepped and draped in the usual sterile fashion, and preoperative antibiotics were administered. A preoperative time-out was performed.   Cystourethroscopy was performed.  The patient's urethra was examined and was unremarkable. The bladder was then  systematically examined in its entirety. There was no evidence for any bladder tumors, stones, or other mucosal pathology.   Attention then turned to the right ureteral orifice and the patient's indwelling ureteral stent was identified and brought out to the urethral meatus with the flexible graspers.  A 0.38 sensor guidewire was then advanced up the righ ureter into the renal pelvis under fluoroscopic guidance.  The wire was then backloaded through the cystoscope and a ureteral stent was advance over the wire using Seldinger technique.  The stent was positioned appropriately under fluoroscopic and cystoscopic guidance.  The wire was then removed with an adequate stent curl noted in the renal pelvis as well as in the bladder.  Attention then turned to the left ureteral orifice and the patient's indwelling ureteral stent was identified and brought out to the urethral meatus with the flexible graspers.  A 0.38 sensor guidewire was then advanced up the left ureter into the renal pelvis under fluoroscopic guidance.  The wire was then backloaded through the cystoscope and a ureteral stent was advance over the wire using Seldinger technique.  The stent was positioned appropriately under fluoroscopic and cystoscopic guidance.  The wire was then removed with an adequate stent curl noted in the renal pelvis as well as in the bladder.  Her stents appear to be in appropriate position but did appear to have significant debris that may be obstructing them.  As such, I did place upsized 8 x 24 ureteral stents to see if this would be beneficial.  The bladder was then emptied and the procedure ended.  The patient appeared to tolerate the procedure well and without complications.  The patient was able to be awakened and transferred to the recovery unit in satisfactory condition.   She will return early next week to repeat her renal function studies.   Pryor Curia MD

## 2020-06-20 NOTE — Anesthesia Procedure Notes (Signed)
Procedure Name: LMA Insertion Date/Time: 06/20/2020 11:31 AM Performed by: Maxwell Caul, CRNA Pre-anesthesia Checklist: Patient identified, Emergency Drugs available, Suction available and Patient being monitored Patient Re-evaluated:Patient Re-evaluated prior to induction Oxygen Delivery Method: Circle system utilized Preoxygenation: Pre-oxygenation with 100% oxygen Induction Type: IV induction LMA: LMA inserted LMA Size: 4.0 Number of attempts: 1 Placement Confirmation: positive ETCO2 and breath sounds checked- equal and bilateral Tube secured with: Tape Dental Injury: Teeth and Oropharynx as per pre-operative assessment

## 2020-06-20 NOTE — Anesthesia Preprocedure Evaluation (Addendum)
Anesthesia Evaluation  Patient identified by MRN, date of birth, ID band Patient awake    Reviewed: Allergy & Precautions, NPO status , Patient's Chart, lab work & pertinent test results  History of Anesthesia Complications Negative for: history of anesthetic complications  Airway Mallampati: III  TM Distance: >3 FB Neck ROM: Full    Dental  (+) Chipped   Pulmonary asthma ,    Pulmonary exam normal breath sounds clear to auscultation       Cardiovascular negative cardio ROS Normal cardiovascular exam Rhythm:Regular Rate:Normal  ECG: NSR, rate 65   Neuro/Psych negative neurological ROS  negative psych ROS   GI/Hepatic Neg liver ROS, GERD  Medicated and Controlled,  Endo/Other  negative endocrine ROS  Renal/GU CRFRenal disease Bladder dysfunction   Bladder cancer     Musculoskeletal negative musculoskeletal ROS (+)   Abdominal   Peds  Hematology  (+) anemia ,  On eliquis    Anesthesia Other Findings bladder cancer on chemo   Reproductive/Obstetrics                           Anesthesia Physical Anesthesia Plan  ASA: III  Anesthesia Plan: General   Post-op Pain Management:    Induction: Intravenous  PONV Risk Score and Plan: 3 and Ondansetron, Dexamethasone, Treatment may vary due to age or medical condition and Midazolam  Airway Management Planned: LMA  Additional Equipment:   Intra-op Plan:   Post-operative Plan: Extubation in OR  Informed Consent: I have reviewed the patients History and Physical, chart, labs and discussed the procedure including the risks, benefits and alternatives for the proposed anesthesia with the patient or authorized representative who has indicated his/her understanding and acceptance.     Dental advisory given  Plan Discussed with: CRNA  Anesthesia Plan Comments:        Anesthesia Quick Evaluation

## 2020-06-20 NOTE — Discharge Instructions (Signed)

## 2020-06-21 ENCOUNTER — Encounter (HOSPITAL_COMMUNITY): Payer: Self-pay | Admitting: Urology

## 2020-06-24 ENCOUNTER — Ambulatory Visit: Payer: Self-pay | Admitting: Oncology

## 2020-07-03 ENCOUNTER — Inpatient Hospital Stay: Payer: Medicare Other | Admitting: Oncology

## 2020-07-03 ENCOUNTER — Inpatient Hospital Stay: Payer: Medicare Other

## 2020-07-03 ENCOUNTER — Other Ambulatory Visit: Payer: Self-pay

## 2020-07-03 VITALS — BP 101/60 | HR 83 | Temp 97.1°F | Resp 17 | Ht 64.0 in | Wt 171.0 lb

## 2020-07-03 DIAGNOSIS — C679 Malignant neoplasm of bladder, unspecified: Secondary | ICD-10-CM | POA: Diagnosis not present

## 2020-07-03 DIAGNOSIS — Z95828 Presence of other vascular implants and grafts: Secondary | ICD-10-CM | POA: Diagnosis not present

## 2020-07-03 DIAGNOSIS — Z5112 Encounter for antineoplastic immunotherapy: Secondary | ICD-10-CM | POA: Diagnosis not present

## 2020-07-03 LAB — CMP (CANCER CENTER ONLY)
ALT: 8 U/L (ref 0–44)
AST: 14 U/L — ABNORMAL LOW (ref 15–41)
Albumin: 2.8 g/dL — ABNORMAL LOW (ref 3.5–5.0)
Alkaline Phosphatase: 90 U/L (ref 38–126)
Anion gap: 11 (ref 5–15)
BUN: 26 mg/dL — ABNORMAL HIGH (ref 8–23)
CO2: 20 mmol/L — ABNORMAL LOW (ref 22–32)
Calcium: 8.9 mg/dL (ref 8.9–10.3)
Chloride: 109 mmol/L (ref 98–111)
Creatinine: 2.5 mg/dL — ABNORMAL HIGH (ref 0.44–1.00)
GFR, Estimated: 20 mL/min — ABNORMAL LOW (ref >60)
Glucose, Bld: 94 mg/dL (ref 70–99)
Potassium: 4.5 mmol/L (ref 3.5–5.1)
Sodium: 140 mmol/L (ref 135–145)
Total Bilirubin: 0.3 mg/dL (ref 0.3–1.2)
Total Protein: 7.2 g/dL (ref 6.5–8.1)

## 2020-07-03 LAB — CBC WITH DIFFERENTIAL (CANCER CENTER ONLY)
Abs Immature Granulocytes: 0.05 10*3/uL (ref 0.00–0.07)
Basophils Absolute: 0.1 10*3/uL (ref 0.0–0.1)
Basophils Relative: 1 %
Eosinophils Absolute: 0.5 10*3/uL (ref 0.0–0.5)
Eosinophils Relative: 7 %
HCT: 30.8 % — ABNORMAL LOW (ref 36.0–46.0)
Hemoglobin: 9.9 g/dL — ABNORMAL LOW (ref 12.0–15.0)
Immature Granulocytes: 1 %
Lymphocytes Relative: 14 %
Lymphs Abs: 1.1 10*3/uL (ref 0.7–4.0)
MCH: 28.9 pg (ref 26.0–34.0)
MCHC: 32.1 g/dL (ref 30.0–36.0)
MCV: 90.1 fL (ref 80.0–100.0)
Monocytes Absolute: 0.6 10*3/uL (ref 0.1–1.0)
Monocytes Relative: 7 %
Neutro Abs: 5.4 10*3/uL (ref 1.7–7.7)
Neutrophils Relative %: 70 %
Platelet Count: 299 10*3/uL (ref 150–400)
RBC: 3.42 MIL/uL — ABNORMAL LOW (ref 3.87–5.11)
RDW: 14.6 % (ref 11.5–15.5)
WBC Count: 7.8 10*3/uL (ref 4.0–10.5)
nRBC: 0 % (ref 0.0–0.2)

## 2020-07-03 MED ORDER — SODIUM CHLORIDE 0.9% FLUSH
10.0000 mL | Freq: Once | INTRAVENOUS | Status: AC
  Administered 2020-07-03: 10 mL
  Filled 2020-07-03: qty 10

## 2020-07-03 MED ORDER — PALONOSETRON HCL INJECTION 0.25 MG/5ML
INTRAVENOUS | Status: AC
  Filled 2020-07-03: qty 5

## 2020-07-03 MED ORDER — PALONOSETRON HCL INJECTION 0.25 MG/5ML
0.2500 mg | Freq: Once | INTRAVENOUS | Status: AC
  Administered 2020-07-03: 0.25 mg via INTRAVENOUS

## 2020-07-03 MED ORDER — SODIUM CHLORIDE 0.9 % IV SOLN
10.0000 mg | Freq: Once | INTRAVENOUS | Status: AC
  Administered 2020-07-03: 10 mg via INTRAVENOUS
  Filled 2020-07-03: qty 10

## 2020-07-03 MED ORDER — SODIUM CHLORIDE 0.9 % IV SOLN
Freq: Once | INTRAVENOUS | Status: AC
  Filled 2020-07-03: qty 250

## 2020-07-03 MED ORDER — SODIUM CHLORIDE 0.9 % IV SOLN
1.0000 mg/kg | Freq: Once | INTRAVENOUS | Status: AC
  Administered 2020-07-03: 80 mg via INTRAVENOUS
  Filled 2020-07-03: qty 6

## 2020-07-03 NOTE — Progress Notes (Signed)
Hematology and Oncology Follow Up Visit  Alexandra Little [id number] 12-07-1945 75 y.o. 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  She was diagnosed and treated in [place] and relocated to this area.  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   Prior Therapy:  She was treated with carboplatin and gemcitabine with the last infusion in March 2021.  She had severe cytopenias after 5 cycles of therapy and was switched to Avelumab in April 2021.    In July 2021 showed worsening of disease occluding retroperitoneal and inguinal adenopathy.  He subsequently was switched to [facility] in September 2021 therapy was poorly tolerated at day 1, 8 and 15 at 28-day cycle.  He subsequently was switched to 1 mg/kg day 1 day 15 out of 28-day cycle.    Last CT scan in February 2022 showed near complete response.  Current therapy: Padcev 1 mg/kg day 1 and day 15 of each cycle.  She is here for day 15 of the current cycle.  Interim History: [name] returns today for a follow-up visit.  Since the last visit, she received day 1 of cycle 1 of Padcev without any complications.  She was noted to have a worsening renal failure and required stent replacement by Dr. Alinda Money.  Since that time, she feels well without any major complaints at this time.  She denies any hematuria, dysuria but does report frequency.  She denies any nausea, vomiting or abdominal pain.  She denies any worsening neuropathy.      Medications: I have reviewed the patient's current medications.  Current Outpatient Medications  Medication Sig Dispense Refill  . acetaminophen (TYLENOL) 500 MG tablet Take 1,000 mg by mouth every 6 (six) hours as needed for mild pain or headache.    . albuterol (VENTOLIN HFA) 108 (90 Base) MCG/ACT inhaler Inhale 2 puffs into the lungs every 4 (four) hours as needed for shortness  of breath.    Marland Kitchen apixaban (ELIQUIS) 5 MG TABS tablet Take 5 mg by mouth 2 (two) times daily.    . diazepam (VALIUM) 10 MG tablet Take 10 mg by mouth every 6 (six) hours as needed for anxiety.    . famotidine (PEPCID) 10 MG tablet Take 10 mg by mouth daily as needed for heartburn.    . hydrOXYzine (ATARAX/VISTARIL) 10 MG tablet Take 1 tablet (10 mg total) by mouth 3 (three) times daily as needed. 30 tablet 0  . lidocaine-prilocaine (EMLA) cream Apply 1 application topically as needed. (Patient taking differently: Apply 1 application topically as needed (numbing).) 30 g 0  . prochlorperazine (COMPAZINE) 10 MG tablet Take 1 tablet (10 mg total) by mouth every 6 (six) hours as needed for nausea or vomiting. 30 tablet 0   No current facility-administered medications for this visit.     Allergies:  Allergies  Allergen Reactions  . Lisinopril Anaphylaxis  . Latex Itching  . Salmon Oil [Nutritional Supplements] Swelling      Physical Exam: Blood pressure 101/60, pulse 83, temperature (!) 97.1 F (36.2 C), temperature source Tympanic, resp. rate 17, height 5\' 4"  (1.626 m), weight 171 lb (77.6 kg), SpO2 99 %.   ECOG: 1    General appearance: Comfortable appearing without any discomfort Head: Normocephalic without any trauma Oropharynx: Mucous membranes are moist and pink without any thrush or ulcers. Eyes: Pupils are equal and round reactive to light. Lymph nodes: No  cervical, supraclavicular, inguinal or axillary lymphadenopathy.   Heart:regular rate and rhythm.  S1 and S2 without leg edema. Lung: Clear without any rhonchi or wheezes.  No dullness to percussion. Abdomin: Soft, nontender, nondistended with good bowel sounds.  No hepatosplenomegaly. Musculoskeletal: No joint deformity or effusion.  Full range of motion noted. Neurological: No deficits noted on motor, sensory and deep tendon reflex exam. Skin: No petechial rash or dryness.  Appeared moist.      Lab Results: Lab  Results  Component Value Date   WBC 8.9 06/19/2020   HGB 11.2 (L) 06/19/2020   HCT 34.9 (L) 06/19/2020   MCV 90.6 06/19/2020   PLT 285 06/19/2020     Chemistry      Component Value Date/Time   NA 139 06/20/2020 0900   K 4.9 06/20/2020 0900   CL 110 06/20/2020 0900   CO2 19 (L) 06/20/2020 0900   BUN 37 (H) 06/20/2020 0900   CREATININE 2.81 (H) 06/20/2020 0900   CREATININE 3.49 (HH) 06/19/2020 0821      Component Value Date/Time   CALCIUM 9.4 06/20/2020 0900   ALKPHOS 113 06/19/2020 0821   AST 14 (L) 06/19/2020 0821   ALT <6 06/19/2020 0821   BILITOT 0.4 06/19/2020 0821        Impression and Plan:   75 year old woman with:  1.    Stage IV urothelial carcinoma of the bladder presented with pelvic adenopathy in December 2020.   Her disease status was updated at this time and treatment options were reviewed.  She is currently on Padcev and has tolerated it very well plan is to continue with the same dose and schedule at this time.  Complication associated with this treatment including nausea, vomiting, neuropathy, hyperglycemia among others.  FGFR inhibitors would be next she developed progression of disease.   The plan is to repeat imaging studies in June 2022.  He is agreeable to proceed at this time.  2.  IV access: Port-A-Cath currently in use without any issues.  3  Antiemetics: No nausea or vomiting reported at this time.  4.  Prognosis and goals of care: Therapy remains palliative although aggressive measures are warranted given her excellent performance status.  5.  Hydronephrosis: Related to her malignancy.  She is status post bilateral ureteral stent placement completed on June 20, 2020 by Dr. Alinda Money.  We will continue to monitor her kidney function which anticipate improved.  6.  Follow-up: Every 2 weeks for Padcev chemotherapy MD follow-up in 4 weeks.   30  minutes were spent on this encounter.  The time was dedicated to reviewing laboratory data,  disease status update treatment options for the future and addressing complications of therapy.   Zola Button, MD 3/24/20228:01 AM

## 2020-07-03 NOTE — Progress Notes (Signed)
Ok to treat today with elevated Scr level per Dr. Alen Blew

## 2020-07-09 ENCOUNTER — Telehealth: Payer: Self-pay | Admitting: Oncology

## 2020-07-09 NOTE — Telephone Encounter (Signed)
Release: [id number]  Released records to [facility] [employer] to [phone]

## 2020-07-17 ENCOUNTER — Other Ambulatory Visit: Payer: Self-pay

## 2020-07-17 ENCOUNTER — Inpatient Hospital Stay: Payer: Medicare Other | Attending: Oncology

## 2020-07-17 ENCOUNTER — Inpatient Hospital Stay: Payer: Medicare Other

## 2020-07-17 VITALS — BP 116/62 | HR 73 | Temp 98.5°F | Resp 18 | Wt 171.5 lb

## 2020-07-17 DIAGNOSIS — Z7901 Long term (current) use of anticoagulants: Secondary | ICD-10-CM | POA: Insufficient documentation

## 2020-07-17 DIAGNOSIS — Z5112 Encounter for antineoplastic immunotherapy: Secondary | ICD-10-CM | POA: Diagnosis not present

## 2020-07-17 DIAGNOSIS — R530 Neoplastic (malignant) related fatigue: Secondary | ICD-10-CM | POA: Insufficient documentation

## 2020-07-17 DIAGNOSIS — G629 Polyneuropathy, unspecified: Secondary | ICD-10-CM | POA: Insufficient documentation

## 2020-07-17 DIAGNOSIS — Z95828 Presence of other vascular implants and grafts: Secondary | ICD-10-CM

## 2020-07-17 DIAGNOSIS — N1330 Unspecified hydronephrosis: Secondary | ICD-10-CM | POA: Diagnosis not present

## 2020-07-17 DIAGNOSIS — I82409 Acute embolism and thrombosis of unspecified deep veins of unspecified lower extremity: Secondary | ICD-10-CM | POA: Diagnosis not present

## 2020-07-17 DIAGNOSIS — Z79899 Other long term (current) drug therapy: Secondary | ICD-10-CM | POA: Diagnosis not present

## 2020-07-17 DIAGNOSIS — C679 Malignant neoplasm of bladder, unspecified: Secondary | ICD-10-CM

## 2020-07-17 LAB — CBC WITH DIFFERENTIAL (CANCER CENTER ONLY)
Abs Immature Granulocytes: 0.05 10*3/uL (ref 0.00–0.07)
Basophils Absolute: 0.1 10*3/uL (ref 0.0–0.1)
Basophils Relative: 1 %
Eosinophils Absolute: 0.4 10*3/uL (ref 0.0–0.5)
Eosinophils Relative: 6 %
HCT: 31.8 % — ABNORMAL LOW (ref 36.0–46.0)
Hemoglobin: 10 g/dL — ABNORMAL LOW (ref 12.0–15.0)
Immature Granulocytes: 1 %
Lymphocytes Relative: 17 %
Lymphs Abs: 1.2 10*3/uL (ref 0.7–4.0)
MCH: 28.9 pg (ref 26.0–34.0)
MCHC: 31.4 g/dL (ref 30.0–36.0)
MCV: 91.9 fL (ref 80.0–100.0)
Monocytes Absolute: 0.5 10*3/uL (ref 0.1–1.0)
Monocytes Relative: 7 %
Neutro Abs: 4.7 10*3/uL (ref 1.7–7.7)
Neutrophils Relative %: 68 %
Platelet Count: 235 10*3/uL (ref 150–400)
RBC: 3.46 MIL/uL — ABNORMAL LOW (ref 3.87–5.11)
RDW: 15.1 % (ref 11.5–15.5)
WBC Count: 7 10*3/uL (ref 4.0–10.5)
nRBC: 0 % (ref 0.0–0.2)

## 2020-07-17 LAB — CMP (CANCER CENTER ONLY)
ALT: <6 U/L (ref 0–44)
AST: 15 U/L (ref 15–41)
Albumin: 3.1 g/dL — ABNORMAL LOW (ref 3.5–5.0)
Alkaline Phosphatase: 102 U/L (ref 38–126)
Anion gap: 12 (ref 5–15)
BUN: 24 mg/dL — ABNORMAL HIGH (ref 8–23)
CO2: 21 mmol/L — ABNORMAL LOW (ref 22–32)
Calcium: 8.8 mg/dL — ABNORMAL LOW (ref 8.9–10.3)
Chloride: 109 mmol/L (ref 98–111)
Creatinine: 2.5 mg/dL — ABNORMAL HIGH (ref 0.44–1.00)
GFR, Estimated: 20 mL/min — ABNORMAL LOW (ref >60)
Glucose, Bld: 96 mg/dL (ref 70–99)
Potassium: 4.5 mmol/L (ref 3.5–5.1)
Sodium: 142 mmol/L (ref 135–145)
Total Bilirubin: 0.3 mg/dL (ref 0.3–1.2)
Total Protein: 7.1 g/dL (ref 6.5–8.1)

## 2020-07-17 MED ORDER — SODIUM CHLORIDE 0.9% FLUSH
10.0000 mL | INTRAVENOUS | Status: DC | PRN
  Administered 2020-07-17: 10 mL
  Filled 2020-07-17: qty 10

## 2020-07-17 MED ORDER — DEXAMETHASONE SODIUM PHOSPHATE 100 MG/10ML IJ SOLN
10.0000 mg | Freq: Once | INTRAMUSCULAR | Status: AC
  Administered 2020-07-17: 10 mg via INTRAVENOUS
  Filled 2020-07-17: qty 10

## 2020-07-17 MED ORDER — SODIUM CHLORIDE 0.9 % IV SOLN
Freq: Once | INTRAVENOUS | Status: AC
Start: 2020-07-17 — End: 2020-07-17
  Filled 2020-07-17: qty 250

## 2020-07-17 MED ORDER — PALONOSETRON HCL INJECTION 0.25 MG/5ML
INTRAVENOUS | Status: AC
  Filled 2020-07-17: qty 5

## 2020-07-17 MED ORDER — SODIUM CHLORIDE 0.9% FLUSH
10.0000 mL | Freq: Once | INTRAVENOUS | Status: AC
  Administered 2020-07-17: 10 mL
  Filled 2020-07-17: qty 10

## 2020-07-17 MED ORDER — HEPARIN SOD (PORK) LOCK FLUSH 100 UNIT/ML IV SOLN
500.0000 [IU] | Freq: Once | INTRAVENOUS | Status: AC | PRN
  Administered 2020-07-17: 500 [IU]
  Filled 2020-07-17: qty 5

## 2020-07-17 MED ORDER — SODIUM CHLORIDE 0.9 % IV SOLN
1.0000 mg/kg | Freq: Once | INTRAVENOUS | Status: AC
  Administered 2020-07-17: 80 mg via INTRAVENOUS
  Filled 2020-07-17: qty 6

## 2020-07-17 MED ORDER — PALONOSETRON HCL INJECTION 0.25 MG/5ML
0.2500 mg | Freq: Once | INTRAVENOUS | Status: AC
  Administered 2020-07-17: 0.25 mg via INTRAVENOUS

## 2020-07-17 NOTE — Progress Notes (Signed)
Per Dr. Alen Blew, okay for patient to proceed with treatment today with creatine 2.5.

## 2020-07-17 NOTE — Patient Instructions (Signed)
[facility] Discharge Instructions for Patients Receiving Chemotherapy  Today you received the following chemotherapy agents padcev  To help prevent nausea and vomiting after your treatment, we encourage you to take your nausea medication as directed   If you develop nausea and vomiting that is not controlled by your nausea medication, call the clinic.   BELOW ARE SYMPTOMS THAT SHOULD BE REPORTED IMMEDIATELY:  *FEVER GREATER THAN 100.5 F  *CHILLS WITH OR WITHOUT FEVER  NAUSEA AND VOMITING THAT IS NOT CONTROLLED WITH YOUR NAUSEA MEDICATION  *UNUSUAL SHORTNESS OF BREATH  *UNUSUAL BRUISING OR BLEEDING  TENDERNESS IN MOUTH AND THROAT WITH OR WITHOUT PRESENCE OF ULCERS  *URINARY PROBLEMS  *BOWEL PROBLEMS  UNUSUAL RASH Items with * indicate a potential emergency and should be followed up as soon as possible.  Feel free to call the clinic should you have any questions or concerns. The clinic phone number is [phone].  Please show the [place] at check-in to the Emergency Department and triage nurse.

## 2020-07-30 MED FILL — Dexamethasone Sodium Phosphate Inj 100 MG/10ML: INTRAMUSCULAR | Qty: 1 | Status: AC

## 2020-07-31 ENCOUNTER — Inpatient Hospital Stay (HOSPITAL_BASED_OUTPATIENT_CLINIC_OR_DEPARTMENT_OTHER): Payer: Medicare Other | Admitting: Oncology

## 2020-07-31 ENCOUNTER — Inpatient Hospital Stay: Payer: Medicare Other

## 2020-07-31 ENCOUNTER — Other Ambulatory Visit: Payer: Medicare Other

## 2020-07-31 ENCOUNTER — Other Ambulatory Visit: Payer: Self-pay

## 2020-07-31 VITALS — BP 120/74 | HR 92 | Temp 96.6°F | Resp 17 | Wt 170.6 lb

## 2020-07-31 DIAGNOSIS — Z95828 Presence of other vascular implants and grafts: Secondary | ICD-10-CM

## 2020-07-31 DIAGNOSIS — C679 Malignant neoplasm of bladder, unspecified: Secondary | ICD-10-CM

## 2020-07-31 DIAGNOSIS — Z5112 Encounter for antineoplastic immunotherapy: Secondary | ICD-10-CM | POA: Diagnosis not present

## 2020-07-31 LAB — CMP (CANCER CENTER ONLY)
ALT: <6 U/L (ref 0–44)
AST: 19 U/L (ref 15–41)
Albumin: 3.4 g/dL — ABNORMAL LOW (ref 3.5–5.0)
Alkaline Phosphatase: 107 U/L (ref 38–126)
Anion gap: 11 (ref 5–15)
BUN: 16 mg/dL (ref 8–23)
CO2: 21 mmol/L — ABNORMAL LOW (ref 22–32)
Calcium: 9.1 mg/dL (ref 8.9–10.3)
Chloride: 110 mmol/L (ref 98–111)
Creatinine: 1.78 mg/dL — ABNORMAL HIGH (ref 0.44–1.00)
GFR, Estimated: 29 mL/min — ABNORMAL LOW (ref >60)
Glucose, Bld: 97 mg/dL (ref 70–99)
Potassium: 4.4 mmol/L (ref 3.5–5.1)
Sodium: 142 mmol/L (ref 135–145)
Total Bilirubin: 0.4 mg/dL (ref 0.3–1.2)
Total Protein: 7.2 g/dL (ref 6.5–8.1)

## 2020-07-31 LAB — CBC WITH DIFFERENTIAL (CANCER CENTER ONLY)
Abs Immature Granulocytes: 0.03 10*3/uL (ref 0.00–0.07)
Basophils Absolute: 0.1 10*3/uL (ref 0.0–0.1)
Basophils Relative: 1 %
Eosinophils Absolute: 0.4 10*3/uL (ref 0.0–0.5)
Eosinophils Relative: 5 %
HCT: 35.5 % — ABNORMAL LOW (ref 36.0–46.0)
Hemoglobin: 11.2 g/dL — ABNORMAL LOW (ref 12.0–15.0)
Immature Granulocytes: 0 %
Lymphocytes Relative: 16 %
Lymphs Abs: 1.2 10*3/uL (ref 0.7–4.0)
MCH: 29.6 pg (ref 26.0–34.0)
MCHC: 31.5 g/dL (ref 30.0–36.0)
MCV: 93.9 fL (ref 80.0–100.0)
Monocytes Absolute: 0.5 10*3/uL (ref 0.1–1.0)
Monocytes Relative: 7 %
Neutro Abs: 5.1 10*3/uL (ref 1.7–7.7)
Neutrophils Relative %: 71 %
Platelet Count: 189 10*3/uL (ref 150–400)
RBC: 3.78 MIL/uL — ABNORMAL LOW (ref 3.87–5.11)
RDW: 16.4 % — ABNORMAL HIGH (ref 11.5–15.5)
WBC Count: 7.3 10*3/uL (ref 4.0–10.5)
nRBC: 0 % (ref 0.0–0.2)

## 2020-07-31 MED ORDER — HEPARIN SOD (PORK) LOCK FLUSH 100 UNIT/ML IV SOLN
500.0000 [IU] | Freq: Once | INTRAVENOUS | Status: AC
  Administered 2020-07-31: 500 [IU]
  Filled 2020-07-31: qty 5

## 2020-07-31 MED ORDER — SODIUM CHLORIDE 0.9% FLUSH
10.0000 mL | Freq: Once | INTRAVENOUS | Status: AC
  Administered 2020-07-31: 10 mL
  Filled 2020-07-31: qty 10

## 2020-07-31 NOTE — Patient Instructions (Signed)
Implanted Port Insertion, Care After This sheet gives you information about how to care for yourself after your procedure. Your health care provider may also give you more specific instructions. If you have problems or questions, contact your health care provider. What can I expect after the procedure? After the procedure, it is common to have:  Discomfort at the port insertion site.  Bruising on the skin over the port. This should improve over 3-4 days. Follow these instructions at home: Port care  After your port is placed, you will get a manufacturer's information card. The card has information about your port. Keep this card with you at all times.  Take care of the port as told by your health care provider. Ask your health care provider if you or a family member can get training for taking care of the port at home. A home health care nurse may also take care of the port.  Make sure to remember what type of port you have. Incision care  Follow instructions from your health care provider about how to take care of your port insertion site. Make sure you: ? Wash your hands with soap and water before and after you change your bandage (dressing). If soap and water are not available, use hand sanitizer. ? Change your dressing as told by your health care provider. ? Leave stitches (sutures), skin glue, or adhesive strips in place. These skin closures may need to stay in place for 2 weeks or longer. If adhesive strip edges start to loosen and curl up, you may trim the loose edges. Do not remove adhesive strips completely unless your health care provider tells you to do that.  Check your port insertion site every day for signs of infection. Check for: ? Redness, swelling, or pain. ? Fluid or blood. ? Warmth. ? Pus or a bad smell.      Activity  Return to your normal activities as told by your health care provider. Ask your health care provider what activities are safe for you.  Do not  lift anything that is heavier than 10 lb (4.5 kg), or the limit that you are told, until your health care provider says that it is safe. General instructions  Take over-the-counter and prescription medicines only as told by your health care provider.  Do not take baths, swim, or use a hot tub until your health care provider approves. Ask your health care provider if you may take showers. You may only be allowed to take sponge baths.  Do not drive for 24 hours if you were given a sedative during your procedure.  Wear a medical alert bracelet in case of an emergency. This will tell any health care providers that you have a port.  Keep all follow-up visits as told by your health care provider. This is important. Contact a health care provider if:  You cannot flush your port with saline as directed, or you cannot draw blood from the port.  You have a fever or chills.  You have redness, swelling, or pain around your port insertion site.  You have fluid or blood coming from your port insertion site.  Your port insertion site feels warm to the touch.  You have pus or a bad smell coming from the port insertion site. Get help right away if:  You have chest pain or shortness of breath.  You have bleeding from your port that you cannot control. Summary  Take care of the port as told by your   health care provider. Keep the manufacturer's information card with you at all times.  Change your dressing as told by your health care provider.  Contact a health care provider if you have a fever or chills or if you have redness, swelling, or pain around your port insertion site.  Keep all follow-up visits as told by your health care provider. This information is not intended to replace advice given to you by your health care provider. Make sure you discuss any questions you have with your health care provider. Document Revised: 10/25/2017 Document Reviewed: 10/25/2017 Elsevier Patient Education   2021 Elsevier Inc.  

## 2020-07-31 NOTE — Progress Notes (Signed)
Hematology and Oncology Follow Up Visit  Alexandra Little [id number] Jan 23, 1946 75 y.o. 07/31/2020 8:35 AM [name], MDMiller, [name], MD   Principle Diagnosis: 75 year old woman with bladder cancer diagnosed in December 2020.  She presented with stage IV high-grade urothelial, FGFR mutated and pelvic adenopathy.   Prior Therapy:  She was treated with carboplatin and gemcitabine with the last infusion in March 2021.  She had severe cytopenias after 5 cycles of therapy and was switched to Avelumab in April 2021.    In July 2021 showed worsening of disease occluding retroperitoneal and inguinal adenopathy.  He subsequently was switched to [facility] in September 2021 therapy was poorly tolerated at day 1, 8 and 15 at 28-day cycle.  He subsequently was switched to 1 mg/kg day 1 day 15 out of 28-day cycle.    Last CT scan in February 2022 showed near complete response.  Current therapy: Padcev 1 mg/kg day 1 and day 15 of each cycle.   He is here for day 15 of the current cycle.  Interim History: Alexandra Little returns today for repeat evaluation.  Since the last visit, she reports worsening neuropathy in her feet and fingers.  Predominantly sensory and mild in her fingers but has worsened in her legs in the last 2 weeks.  She has reported more fatigue and tiredness associated with her treatment.  She denies any nausea, vomiting or abdominal pain.  He denies any recent hospitalizations or illnesses.      Medications: Updated on review. Current Outpatient Medications  Medication Sig Dispense Refill  . acetaminophen (TYLENOL) 500 MG tablet Take 1,000 mg by mouth every 6 (six) hours as needed for mild pain or headache.    . albuterol (VENTOLIN HFA) 108 (90 Base) MCG/ACT inhaler Inhale 2 puffs into the lungs every 4 (four) hours as needed for shortness of breath.    Marland Kitchen apixaban (ELIQUIS) 5 MG TABS tablet Take 5 mg by mouth 2 (two) times daily.    . diazepam (VALIUM) 10 MG tablet Take 10 mg by mouth every 6  (six) hours as needed for anxiety.    . famotidine (PEPCID) 10 MG tablet Take 10 mg by mouth daily as needed for heartburn.    . hydrOXYzine (ATARAX/VISTARIL) 10 MG tablet Take 1 tablet (10 mg total) by mouth 3 (three) times daily as needed. 30 tablet 0  . lidocaine-prilocaine (EMLA) cream Apply 1 application topically as needed. (Patient taking differently: Apply 1 application topically as needed (numbing).) 30 g 0  . prochlorperazine (COMPAZINE) 10 MG tablet Take 1 tablet (10 mg total) by mouth every 6 (six) hours as needed for nausea or vomiting. 30 tablet 0   No current facility-administered medications for this visit.     Allergies:  Allergies  Allergen Reactions  . Lisinopril Anaphylaxis  . Latex Itching  . Salmon Oil [Nutritional Supplements] Swelling      Physical Exam:  Blood pressure 120/74, pulse 92, temperature (!) 96.6 F (35.9 C), temperature source Tympanic, resp. rate 17, weight 170 lb 9.6 oz (77.4 kg), SpO2 99 %.   ECOG: 1    General appearance: Alert, awake without any distress. Head: Atraumatic without abnormalities Oropharynx: Without any thrush or ulcers. Eyes: No scleral icterus. Lymph nodes: No lymphadenopathy noted in the cervical, supraclavicular, or axillary nodes Heart:regular rate and rhythm, without any murmurs or gallops.   Lung: Clear to auscultation without any rhonchi, wheezes or dullness to percussion. Abdomin: Soft, nontender without any shifting dullness or ascites. Musculoskeletal: No clubbing  or cyanosis. Neurological: No motor or sensory deficits. Skin: No rashes or lesions.       Lab Results: Lab Results  Component Value Date   WBC 7.0 07/17/2020   HGB 10.0 (L) 07/17/2020   HCT 31.8 (L) 07/17/2020   MCV 91.9 07/17/2020   PLT 235 07/17/2020     Chemistry      Component Value Date/Time   NA 142 07/17/2020 0932   K 4.5 07/17/2020 0932   CL 109 07/17/2020 0932   CO2 21 (L) 07/17/2020 0932   BUN 24 (H) 07/17/2020 0932    CREATININE 2.50 (H) 07/17/2020 0932      Component Value Date/Time   CALCIUM 8.8 (L) 07/17/2020 0932   ALKPHOS 102 07/17/2020 0932   AST 15 07/17/2020 0932   ALT <6 07/17/2020 0932   BILITOT 0.3 07/17/2020 0932        Impression and Plan:   75 year old woman with:  1.    Bladder cancer diagnosed in December 2020.  She was found to have stage IV urothelial carcinoma with abdominal and pelvic adenopathy.  She continues to tolerate Padcev without any major complications.  Risks and benefits of continuing this treatment were reviewed at this time.  Complications including peripheral neuropathy, hyperglycemia and infusion related complications.    After discussion today, she has experienced worrisome neuropathy and I recommended discontinuation of treatment temporarily until her symptoms resolved.  We will hold treatment until Aug 28, 2020 and potentially restart treatment after updating her staging scans.  Alternative treatment options including FGFR inhibitors could also be considered.    2.  IV access: Port-A-Cath continues to be accessed without difficulties and remains in use.  3  Antiemetics: Compazine is available to her without any nausea or vomiting.  4.  Prognosis and goals of care: Her disease is incurable although aggressive measures are warranted given her reasonable performance status and excellent response.  5.  Hydronephrosis: Bilateral stents are in place with improvement in her kidney function.  This is related to her pelvic adenopathy.  6.  Thromboembolism: She is currently on Eliquis which will be continued indefinitely.  Thromboembolism is related to malignancy.  7.  Follow-up: On Aug 28, 2020 for repeat evaluation.   30  minutes were dedicated to this visit.  The time was spent on reviewing laboratory data, disease status update and addressing complications related to her cancer and cancer therapy.   Zola Button, MD 4/21/20228:35 AM

## 2020-08-14 ENCOUNTER — Ambulatory Visit: Payer: Medicare Other

## 2020-08-14 ENCOUNTER — Encounter: Payer: Self-pay | Admitting: Oncology

## 2020-08-14 ENCOUNTER — Other Ambulatory Visit: Payer: Medicare Other

## 2020-08-20 ENCOUNTER — Other Ambulatory Visit: Payer: Self-pay | Admitting: Urology

## 2020-08-21 ENCOUNTER — Ambulatory Visit (HOSPITAL_COMMUNITY)
Admission: RE | Admit: 2020-08-21 | Discharge: 2020-08-21 | Disposition: A | Discharge status: Home / Self Care | Payer: Medicare Other | Source: Ambulatory Visit | Attending: Oncology | Admitting: Oncology

## 2020-08-21 ENCOUNTER — Other Ambulatory Visit: Payer: Self-pay

## 2020-08-21 DIAGNOSIS — C679 Malignant neoplasm of bladder, unspecified: Secondary | ICD-10-CM | POA: Diagnosis present

## 2020-08-21 DIAGNOSIS — Z95828 Presence of other vascular implants and grafts: Secondary | ICD-10-CM | POA: Insufficient documentation

## 2020-08-27 MED FILL — Dexamethasone Sodium Phosphate Inj 100 MG/10ML: INTRAMUSCULAR | Qty: 1 | Status: AC

## 2020-08-28 ENCOUNTER — Inpatient Hospital Stay (HOSPITAL_BASED_OUTPATIENT_CLINIC_OR_DEPARTMENT_OTHER): Payer: Medicare Other | Admitting: Oncology

## 2020-08-28 ENCOUNTER — Other Ambulatory Visit: Payer: Medicare Other

## 2020-08-28 ENCOUNTER — Inpatient Hospital Stay: Payer: Medicare Other

## 2020-08-28 ENCOUNTER — Other Ambulatory Visit: Payer: Self-pay

## 2020-08-28 ENCOUNTER — Inpatient Hospital Stay: Payer: Medicare Other | Attending: Oncology

## 2020-08-28 VITALS — BP 133/86 | HR 90 | Temp 97.6°F | Resp 18 | Ht 64.0 in | Wt 172.9 lb

## 2020-08-28 DIAGNOSIS — Z79899 Other long term (current) drug therapy: Secondary | ICD-10-CM | POA: Diagnosis not present

## 2020-08-28 DIAGNOSIS — G620 Drug-induced polyneuropathy: Secondary | ICD-10-CM | POA: Diagnosis not present

## 2020-08-28 DIAGNOSIS — Z95828 Presence of other vascular implants and grafts: Secondary | ICD-10-CM | POA: Diagnosis not present

## 2020-08-28 DIAGNOSIS — C679 Malignant neoplasm of bladder, unspecified: Secondary | ICD-10-CM | POA: Diagnosis not present

## 2020-08-28 DIAGNOSIS — N1330 Unspecified hydronephrosis: Secondary | ICD-10-CM | POA: Insufficient documentation

## 2020-08-28 DIAGNOSIS — T451X5A Adverse effect of antineoplastic and immunosuppressive drugs, initial encounter: Secondary | ICD-10-CM | POA: Insufficient documentation

## 2020-08-28 DIAGNOSIS — I82402 Acute embolism and thrombosis of unspecified deep veins of left lower extremity: Secondary | ICD-10-CM | POA: Diagnosis not present

## 2020-08-28 DIAGNOSIS — Z7901 Long term (current) use of anticoagulants: Secondary | ICD-10-CM | POA: Diagnosis not present

## 2020-08-28 LAB — CBC WITH DIFFERENTIAL (CANCER CENTER ONLY)
Abs Immature Granulocytes: 0.03 10*3/uL (ref 0.00–0.07)
Basophils Absolute: 0.1 10*3/uL (ref 0.0–0.1)
Basophils Relative: 1 %
Eosinophils Absolute: 0.3 10*3/uL (ref 0.0–0.5)
Eosinophils Relative: 5 %
HCT: 38.4 % (ref 36.0–46.0)
Hemoglobin: 12.1 g/dL (ref 12.0–15.0)
Immature Granulocytes: 1 %
Lymphocytes Relative: 19 %
Lymphs Abs: 1.2 10*3/uL (ref 0.7–4.0)
MCH: 30 pg (ref 26.0–34.0)
MCHC: 31.5 g/dL (ref 30.0–36.0)
MCV: 95 fL (ref 80.0–100.0)
Monocytes Absolute: 0.5 10*3/uL (ref 0.1–1.0)
Monocytes Relative: 7 %
Neutro Abs: 4.1 10*3/uL (ref 1.7–7.7)
Neutrophils Relative %: 67 %
Platelet Count: 237 10*3/uL (ref 150–400)
RBC: 4.04 MIL/uL (ref 3.87–5.11)
RDW: 15.5 % (ref 11.5–15.5)
WBC Count: 6.1 10*3/uL (ref 4.0–10.5)
nRBC: 0 % (ref 0.0–0.2)

## 2020-08-28 LAB — CMP (CANCER CENTER ONLY)
ALT: <6 U/L (ref 0–44)
AST: 17 U/L (ref 15–41)
Albumin: 3.6 g/dL (ref 3.5–5.0)
Alkaline Phosphatase: 105 U/L (ref 38–126)
Anion gap: 11 (ref 5–15)
BUN: 33 mg/dL — ABNORMAL HIGH (ref 8–23)
CO2: 23 mmol/L (ref 22–32)
Calcium: 9.5 mg/dL (ref 8.9–10.3)
Chloride: 107 mmol/L (ref 98–111)
Creatinine: 1.8 mg/dL — ABNORMAL HIGH (ref 0.44–1.00)
GFR, Estimated: 29 mL/min — ABNORMAL LOW (ref >60)
Glucose, Bld: 94 mg/dL (ref 70–99)
Potassium: 4.6 mmol/L (ref 3.5–5.1)
Sodium: 141 mmol/L (ref 135–145)
Total Bilirubin: 0.4 mg/dL (ref 0.3–1.2)
Total Protein: 7.3 g/dL (ref 6.5–8.1)

## 2020-08-28 MED ORDER — SODIUM CHLORIDE 0.9% FLUSH
10.0000 mL | Freq: Once | INTRAVENOUS | Status: AC
  Administered 2020-08-28: 10 mL
  Filled 2020-08-28: qty 10

## 2020-08-28 MED ORDER — HEPARIN SOD (PORK) LOCK FLUSH 100 UNIT/ML IV SOLN
500.0000 [IU] | Freq: Once | INTRAVENOUS | Status: AC
  Administered 2020-08-28: 500 [IU]
  Filled 2020-08-28: qty 5

## 2020-08-28 NOTE — Progress Notes (Signed)
Hematology and Oncology Follow Up Visit  Alexandra Little [id number] [birth year] 75 y.o. 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  She was found to have pelvic adenopathy with FGFR mutation.   Prior Therapy:  She was treated with carboplatin and gemcitabine with the last infusion in March 2021.  She had severe cytopenias after 5 cycles of therapy and was switched to Avelumab in April 2021.    In July 2021 showed worsening of disease occluding retroperitoneal and inguinal adenopathy.  He subsequently was switched to [facility] in September 2021 therapy was poorly tolerated at day 1, 8 and 15 at 28-day cycle.  He subsequently was switched to 1 mg/kg day 1 day 15 out of 28-day cycle.    Last CT scan in February 2022 showed near complete response.  Current therapy: Padcev 1 mg/kg day 1 and day 15 of each cycle.  He received day 1 on July 17, 2020 and therapy has been on hold since that time.  Interim History: [name] returns today for repeat evaluation.  Since her last visit, she reports no major changes in her health.  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  It is described as predominantly numbness at the bottom of her feet and the tips of her fingers.  It is not interfering with her function but her mobility has slowed down.  She denies any constitutional symptoms weight loss or appetite changes.  Performance status and activity level remains unchanged.      Medications: Reviewed without changes. Current Outpatient Medications  Medication Sig Dispense Refill  . acetaminophen (TYLENOL) 500 MG tablet Take 1,000 mg by mouth every 6 (six) hours as needed for mild pain or headache.    . albuterol (VENTOLIN HFA) 108 (90 Base) MCG/ACT inhaler Inhale 2 puffs into the lungs every 4 (four) hours as  needed for shortness of breath.    Marland Kitchen apixaban (ELIQUIS) 5 MG TABS tablet Take 5 mg by mouth 2 (two) times daily.    . diazepam (VALIUM) 10 MG tablet Take 10 mg by mouth every 6 (six) hours as needed for anxiety.    . famotidine (PEPCID) 10 MG tablet Take 10 mg by mouth daily as needed for heartburn.    . hydrOXYzine (ATARAX/VISTARIL) 10 MG tablet Take 1 tablet (10 mg total) by mouth 3 (three) times daily as needed. 30 tablet 0  . lidocaine-prilocaine (EMLA) cream Apply 1 application topically as needed. (Patient taking differently: Apply 1 application topically as needed (numbing).) 30 g 0  . prochlorperazine (COMPAZINE) 10 MG tablet Take 1 tablet (10 mg total) by mouth every 6 (six) hours as needed for nausea or vomiting. 30 tablet 0   No current facility-administered medications for this visit.     Allergies:  Allergies  Allergen Reactions  . Lisinopril Anaphylaxis  . Latex Itching  . Salmon Oil [Nutritional Supplements] Swelling      Physical Exam:  Blood pressure 133/86, pulse 90, temperature 97.6 F (36.4 C), temperature source Tympanic, resp. rate 18, height 5\' 4"  (1.626 m), weight 172 lb 14.4 oz (78.4 kg), SpO2 99 %.    ECOG: 1   General appearance: Comfortable appearing without any discomfort Head: Normocephalic without any trauma Oropharynx: Mucous membranes are moist and pink without any thrush or ulcers. Eyes: Pupils are equal and round reactive to light. Lymph  nodes: No cervical, supraclavicular, inguinal or axillary lymphadenopathy.   Heart:regular rate and rhythm.  S1 and S2 without leg edema. Lung: Clear without any rhonchi or wheezes.  No dullness to percussion. Abdomin: Soft, nontender, nondistended with good bowel sounds.  No hepatosplenomegaly. Musculoskeletal: No joint deformity or effusion.  Full range of motion noted. Neurological: No deficits noted on motor, sensory and deep tendon reflex exam. Skin: No petechial rash or dryness.  Appeared moist.          Lab Results: Lab Results  Component Value Date   WBC 7.3 07/31/2020   HGB 11.2 (L) 07/31/2020   HCT 35.5 (L) 07/31/2020   MCV 93.9 07/31/2020   PLT 189 07/31/2020     Chemistry      Component Value Date/Time   NA 142 07/31/2020 0843   K 4.4 07/31/2020 0843   CL 110 07/31/2020 0843   CO2 21 (L) 07/31/2020 0843   BUN 16 07/31/2020 0843   CREATININE 1.78 (H) 07/31/2020 0843      Component Value Date/Time   CALCIUM 9.1 07/31/2020 0843   ALKPHOS 107 07/31/2020 0843   AST 19 07/31/2020 0843   ALT <6 07/31/2020 0843   BILITOT 0.4 07/31/2020 0843       IMPRESSION: 1. No definite evidence for metastatic disease on this noncontrast study of the chest, abdomen, and pelvis. 2. Tiny nodular focus anterior to the bladder could be a tiny diverticulum or small confluent area of edema. Transmural tumor extension or adjacent lymph node not excluded. Attention on follow-up recommended. 3. Subtle nodularity of liver contour raises the question of but is not diagnostic for cirrhosis. 4. Atrophic left kidney with bilateral ureteral stents and mild bilateral hydroureteronephrosis. 5.  Aortic Atherosclerois (ICD10-170.0)   Impression and Plan:   75 year old woman with:  1.    Stage IV high-grade urothelial carcinoma of the bladder with abdominal and pelvic adenopathy diagnosed in 2021.  Her disease status was updated at this time and imaging studies were reviewed.  CT scan obtained on Aug 21, 2020 was personally reviewed and discussed with the patient.  She continues to have a complete response to therapy without any residual disease.  Treatment options moving forward were discussed at this time.  Resuming and Padcev at lower dose and less frequent administration versus discontinuation of therapy altogether and reinstitute therapy upon disease progression were discussed.  After discussion today, we opted to continue to hold treatment till her neuropathy decreases  further to grade 1 or lower before potentially reintroducing Padcev at a lower dose and less frequent dosing.   2.  IV access: Port-A-Cath remains in place without any issues.  3  Antiemetics: No nausea or vomiting reported at this time.  Compazine is available to her.  4.  Prognosis and goals of care: Therapy remains palliative although aggressive measures are warranted given her excellent response and performance status.  5.  Hydronephrosis: Related to her pelvic adenopathy with bilateral stents in place.  She continues to follow with Dr. Alinda Money regarding this issue.  6.  Thromboembolism: Related to malignancy and currently on Eliquis.  7.  Neuropathy: Related to Padcev continues to be evident and bothersome but not progressing.  We will continue to hold treatment at this time.  She does not require any neuropathic pain medication.  8.  Follow-up: In 4 weeks for repeat follow-up.  30  minutes were spent on this encounter.  The time was dedicated to reviewing disease status, discussing treatment options, addressing complications  related to cancer and cancer therapy.   Zola Button, MD 5/19/20227:56 AM

## 2020-08-29 ENCOUNTER — Telehealth: Payer: Self-pay | Admitting: Oncology

## 2020-08-29 NOTE — Telephone Encounter (Signed)
Sch per 5/19 los, pt aware

## 2020-09-15 NOTE — Patient Instructions (Addendum)
DUE TO COVID-19 ONLY ONE VISITOR IS ALLOWED TO COME WITH YOU AND STAY IN THE WAITING ROOM ONLY DURING PRE OP AND PROCEDURE.   **NO VISITORS ARE ALLOWED IN THE SHORT STAY AREA OR RECOVERY ROOM!!**        Your procedure is scheduled on: 09/22/20   Report to [facility] Main  Entrance    Report to admitting at 1:00   Call this number if you have problems the morning of surgery [phone]   Do not eat food :After Midnight.   May have liquids until  12:00 PM  day of surgery  CLEAR LIQUID DIET  Foods Allowed                                                                     Foods Excluded  Water, Black Coffee and tea, regular and decaf             liquids that you cannot  Plain Jell-O in any flavor  (No red)                                    see through such as: Fruit ices (not with fruit pulp)                                            milk, soups, orange juice              Iced Popsicles (No red)                                                All solid food                                   Apple juices Sports drinks like Gatorade (No red) Lightly seasoned clear broth or consume(fat free) Sugar, honey syrup     Oral Hygiene is also important to reduce your risk of infection.                                    Remember - BRUSH YOUR TEETH THE MORNING OF SURGERY WITH YOUR REGULAR TOOTHPASTE   Do NOT smoke after Midnight   Take these medicines the morning of surgery with A SIP OF WATER: Acetaminophen, Diazepam.                               You may not have any metal on your body including hair pins, jewelry, and body piercing             Do not wear make-up, lotions, powders, perfumes, or deodorant  Do not wear nail polish including gel and S&S, artificial/acrylic nails, or any other type of covering on natural nails  including finger and   toenails. If you have artificial nails, gel coating, etc. that needs to be removed by a nail salon please have this removed prior to  surgery or surgery   may need to be canceled/ delayed if the surgeon/ anesthesia feels like they are unable to be safely monitored.   Do not shave  48 hours prior to surgery.    Do not bring valuables to the hospital. [place].   Contacts, dentures or bridgework may not be worn into surgery.     Patients discharged the day of surgery will not be allowed to drive home.  Special Instructions: Bring a copy of your healthcare power of attorney and living will documents         the day of surgery if you haven't scanned them  in before.              Please read over the following fact sheets you were given: IF YOU HAVE QUESTIONS ABOUT YOUR PRE OP INSTRUCTIONS PLEASE CALL  3 [facility] - Preparing for Surgery Before surgery, you can play an important role.  Because skin is not sterile, your skin needs to be as free of germs as possible.  You can reduce the number of germs on your skin by washing with CHG (chlorahexidine gluconate) soap before surgery.  CHG is an antiseptic cleaner which kills germs and bonds with the skin to continue killing germs even after washing. Please DO NOT use if you have an allergy to CHG or antibacterial soaps.  If your skin becomes reddened/irritated stop using the CHG and inform your nurse when you arrive at Short Stay. Do not shave (including legs and underarms) for at least 48 hours prior to the first CHG shower.  You may shave your face/neck.  Please follow these instructions carefully:  1.  Shower with CHG Soap the night before surgery and the  morning of surgery.  2.  If you choose to wash your hair, wash your hair first as usual with your normal  shampoo.  3.  After you shampoo, rinse your hair and body thoroughly to remove the shampoo.                             4.  Use CHG as you would any other liquid soap.  You can apply chg directly to the skin and wash.  Gently with a scrungie or clean  washcloth.  5.  Apply the CHG Soap to your body ONLY FROM THE NECK DOWN.   Do   not use on face/ open                           Wound or open sores. Avoid contact with eyes, ears mouth and   genitals (private parts).                       Wash face,  Genitals (private parts) with your normal soap.             6.  Wash thoroughly, paying special attention to the area where your    surgery  will be performed.  7.  Thoroughly rinse your body with warm water from the neck down.  8.  DO NOT shower/wash with your normal soap after using and rinsing off the CHG  Soap.                9.  Pat yourself dry with a clean towel.            10.  Wear clean pajamas.            11.  Place clean sheets on your bed the night of your first shower and do not  sleep with pets. Day of Surgery : Do not apply any lotions/deodorants the morning of surgery.  Please wear clean clothes to the hospital/surgery center.  FAILURE TO FOLLOW THESE INSTRUCTIONS MAY RESULT IN THE CANCELLATION OF YOUR SURGERY  PATIENT SIGNATURE_________________________________  NURSE SIGNATURE__________________________________  ________________________________________________________________________

## 2020-09-15 NOTE — Progress Notes (Addendum)
COVID Vaccine Completed:  Yes x4 Date COVID Vaccine completed: Has received booster: 2nd booster 6/22 COVID vaccine manufacturer:  Moderna    Date of COVID positive in last 90 days:  N/A  PCP - [name], MD Cardiologist - N/A  Chest x-ray - CT chest 08-22-20 Epic EKG - 06-20-20 Epic Stress Test - N/A ECHO - N/A Cardiac Cath - N/A Pacemaker/ICD device last checked: Spinal Cord Stimulator:  Sleep Study - N/A CPAP -   Fasting Blood Sugar - N/A Checks Blood Sugar _____ times a day  Blood Thinner Instructions:  Eliquis 5 mg.  Patient not to stop Aspirin Instructions: Last Dose:  Activity level:  Can go up a flight of stairs and perform activities of daily living without stopping and without symptoms of chest pain or shortness of breath.   Anesthesia review: N/A  Patient denies shortness of breath, fever, cough and chest pain at PAT appointment   Patient verbalized understanding of instructions that were given to them at the PAT appointment. Patient was also instructed that they will need to review over the PAT instructions again at home before surgery.

## 2020-09-18 ENCOUNTER — Encounter (HOSPITAL_COMMUNITY)
Admission: RE | Admit: 2020-09-18 | Discharge: 2020-09-18 | Disposition: A | Discharge status: Home / Self Care | Payer: Medicare Other | Source: Ambulatory Visit | Attending: Urology | Admitting: Urology

## 2020-09-18 ENCOUNTER — Encounter (HOSPITAL_COMMUNITY): Payer: Self-pay

## 2020-09-18 ENCOUNTER — Other Ambulatory Visit: Payer: Self-pay

## 2020-09-18 HISTORY — DX: Other complications of anesthesia, initial encounter: T88.59XA

## 2020-09-19 NOTE — H&P (Signed)
1. Metastatic urothelial carcinoma of the bladder  2. Bilateral ureteral obstruction   Alexandra Little returns today. After her last visit, she was noted to have significant worsening of her renal function resulting in her undergoing an urgent bilateral ureteral stent change with up sizing of her stents to 8 French stents. Her renal function subsequently declined. Her most recent serum creatinine on 07/31/2020 was 1.78. This is close to her baseline. Her creatinine prior to stent change had been over 3. She has been tolerating her stents as expected. She continues to have fairly severe incontinence although admits that she is quite fastidious and will change her pads quite frequently. Her incontinence is sometimes continuous but also possibly urge related. She has not yet tried [place] to see if this would be helpful. She also states that she has been having some dysuria. She has seen this improved when she has been on antibiotic therapy. She also has previously been on Uribel with improvement. She is currently off systemic therapy for a break but plans to restart therapy in the near future.     ALLERGIES: Latex Lisinopril Nutritional Supplement Salmon    MEDICATIONS: Eliquis 5 mg tablet  Nystatin 100,000 unit/gram powder Apply to affected area tid for up to 2 weeks  Uribel 118 mg-10 mg-40.8 mg-36 mg-0.12 mg capsule 1 capsule PO Q 8 H As Directed     GU PSH: Cystoscopy Insert Stent, Bilateral - 06/20/2020     NON-GU PSH: No Non-GU PSH    GU PMH: Bladder Cancer overlapping sites - 06/18/2020 Ureteral obstruction - 06/18/2020 History of bladder cancer Kidney Failure Unspec      PMH Notes:   1) Metastatic urothelial cancer of the bladder: Alexandra Little relocated from [place] to [place] in March 2022 to be closer to her daughter, Alexandra Little. She was treated with systemic therapy at [facility]. Due to her chronic kidney disease, she was not a candidate for cisplatin chemotherapy and was  treated with carboplatin and gemcitabine. She did not tolerate this well and has subsequently been on multiple immunotherapy ease. When she presented to me, she was on a alternate dose of Padcev and has been doing fairly well with this. She has had a complete response based on imaging and continues on this therapy and has established care with Dr. Alen Blew.   2) Bilateral ureteral obstructionL In October of 2020 when she was diagnosed, she was also noted to have ureteral obstruction and underwent bilateral nephrostomy tube placement. She recently had internalization of bilateral ureteral stents and removal of her nephrostomy tubes. She seems to have tolerated this quite well. Her serum creatinine a few weeks ago was 1.4 and this was felt to be very consistent with her baseline possibly better than her baseline which has hovered around 1.6. She has been tolerating her stents relatively well. Her Cr was elevated over 3 when she presented to me but declined after I changed and upsized her stents in March 2022.   Mar 2022: Bilateral ureteral stent change (upsized to 8 x 24)   3) Incontinence: She does have chronic urinary incontinence and uses approximately 4 pads per day. However, this is not significantly worse since her ureteral stents were placed. She describes her incontinence as unconscious. It is unclear whether she may have some stress related incontinence and urge related incontinence. She is unable to provide an accurate history in this regard. She does have some significant vaginal in skin irritation related to her pads and incontinence.  NON-GU PMH: Asthma DVT, History    FAMILY HISTORY: 1 Daughter - Runs in Family   SOCIAL HISTORY: Marital Status: Unknown Preferred Language: English; Ethnicity: Not Hispanic Or Latino; Race: White Current Smoking Status: Patient has never smoked.   Tobacco Use Assessment Completed: Used Tobacco in last 30 days? Does drink.  Drinks 2 caffeinated drinks per  day.    REVIEW OF SYSTEMS:    GU Review Female:   Patient denies frequent urination, hard to postpone urination, burning /pain with urination, get up at night to urinate, leakage of urine, stream starts and stops, trouble starting your stream, have to strain to urinate, and currently pregnant.  Gastrointestinal (Upper):   Patient denies nausea and vomiting.  Gastrointestinal (Lower):   Patient denies diarrhea and constipation.  Constitutional:   Patient denies fever, night sweats, weight loss, and fatigue.  Skin:   Patient denies skin rash/ lesion and itching.  Eyes:   Patient denies blurred vision and double vision.  Ears/ Nose/ Throat:   Patient denies sinus problems and sore throat.  Hematologic/Lymphatic:   Patient denies swollen glands and easy bruising.  Cardiovascular:   Patient denies leg swelling and chest pains.  Respiratory:   Patient denies cough and shortness of breath.  Endocrine:   Patient denies excessive thirst.  Musculoskeletal:   Patient denies back pain and joint pain.  Neurological:   Patient denies headaches and dizziness.  Psychologic:   Patient denies depression and anxiety.   VITAL SIGNS:     Weight 170 lb / 77.11 kg   BMI 28.3 kg/m   MULTI-SYSTEM PHYSICAL EXAMINATION:    Constitutional: Well-nourished. No physical deformities. Normally developed. Good grooming.  Respiratory: No labored breathing, no use of accessory muscles. Clear bilaterally.  Cardiovascular: Normal temperature, normal extremity pulses, no swelling, no varicosities. RRR.     Complexity of Data:  Lab Test Review:   BMP  Records Review:   Previous Patient Records   PROCEDURES:         PVR Ultrasound - 76734  Scanned Volume: 12 cc       ASSESSMENT:      ICD-10 Details  1 GU:   Ureteral obstruction - N13.1   2   Bladder Cancer overlapping sites - C67.8    PLAN:      1. Bilateral ureteral obstruction secondary to metastatic bladder cancer: She will continue with ureteral stent  management and will be scheduled for cystoscopy with bilateral ureteral stent change in mid to late June. We have reviewed this procedure again in detail in reviewed the potential risks and the expected recovery process. She gives informed consent to proceed. Her urine will be cultured today and she will be treated with perioperative antibiotics as indicated. Her renal function will also be checked today.

## 2020-09-22 ENCOUNTER — Ambulatory Visit (HOSPITAL_COMMUNITY): Payer: Medicare Other | Admitting: Certified Registered Nurse Anesthetist

## 2020-09-22 ENCOUNTER — Ambulatory Visit (HOSPITAL_COMMUNITY)
Admission: RE | Admit: 2020-09-22 | Discharge: 2020-09-22 | Disposition: A | Discharge status: Home / Self Care | Payer: Medicare Other | Attending: Urology | Admitting: Urology

## 2020-09-22 ENCOUNTER — Encounter (HOSPITAL_COMMUNITY)
Admission: RE | Disposition: A | Discharge status: Home / Self Care | Payer: Self-pay | Source: Home / Self Care | Attending: Urology

## 2020-09-22 ENCOUNTER — Encounter (HOSPITAL_COMMUNITY): Payer: Self-pay | Admitting: Urology

## 2020-09-22 ENCOUNTER — Ambulatory Visit (HOSPITAL_COMMUNITY): Payer: Medicare Other

## 2020-09-22 DIAGNOSIS — Z79899 Other long term (current) drug therapy: Secondary | ICD-10-CM | POA: Diagnosis not present

## 2020-09-22 DIAGNOSIS — N39498 Other specified urinary incontinence: Secondary | ICD-10-CM | POA: Diagnosis not present

## 2020-09-22 DIAGNOSIS — Z8551 Personal history of malignant neoplasm of bladder: Secondary | ICD-10-CM | POA: Diagnosis not present

## 2020-09-22 DIAGNOSIS — N135 Crossing vessel and stricture of ureter without hydronephrosis: Secondary | ICD-10-CM | POA: Insufficient documentation

## 2020-09-22 DIAGNOSIS — Z91018 Allergy to other foods: Secondary | ICD-10-CM | POA: Diagnosis not present

## 2020-09-22 DIAGNOSIS — Z888 Allergy status to other drugs, medicaments and biological substances status: Secondary | ICD-10-CM | POA: Diagnosis not present

## 2020-09-22 HISTORY — PX: CYSTOSCOPY WITH STENT PLACEMENT: SHX5790

## 2020-09-22 SURGERY — CYSTOSCOPY, WITH STENT INSERTION
Anesthesia: General | Laterality: Bilateral

## 2020-09-22 MED ORDER — SODIUM CHLORIDE 0.9 % IV SOLN
2.0000 g | Freq: Once | INTRAVENOUS | Status: AC
  Administered 2020-09-22: 2 g via INTRAVENOUS

## 2020-09-22 MED ORDER — FENTANYL CITRATE (PF) 100 MCG/2ML IJ SOLN
INTRAMUSCULAR | Status: DC | PRN
  Administered 2020-09-22: 50 ug via INTRAVENOUS

## 2020-09-22 MED ORDER — ONDANSETRON HCL 4 MG/2ML IJ SOLN
INTRAMUSCULAR | Status: DC | PRN
  Administered 2020-09-22: 4 mg via INTRAVENOUS

## 2020-09-22 MED ORDER — PROPOFOL 10 MG/ML IV BOLUS
INTRAVENOUS | Status: DC | PRN
  Administered 2020-09-22: 200 mg via INTRAVENOUS

## 2020-09-22 MED ORDER — EPHEDRINE SULFATE 50 MG/ML IJ SOLN
INTRAMUSCULAR | Status: DC | PRN
  Administered 2020-09-22: 10 mg via INTRAVENOUS

## 2020-09-22 MED ORDER — FENTANYL CITRATE (PF) 100 MCG/2ML IJ SOLN
INTRAMUSCULAR | Status: AC
  Filled 2020-09-22: qty 2

## 2020-09-22 MED ORDER — SODIUM CHLORIDE 0.9 % IV SOLN
INTRAVENOUS | Status: AC
  Filled 2020-09-22: qty 20

## 2020-09-22 MED ORDER — LACTATED RINGERS IV SOLN: INTRAVENOUS | Status: DC

## 2020-09-22 MED ORDER — LIDOCAINE 2% (20 MG/ML) 5 ML SYRINGE
INTRAMUSCULAR | Status: DC | PRN
  Administered 2020-09-22: 80 mg via INTRAVENOUS

## 2020-09-22 MED ORDER — CHLORHEXIDINE GLUCONATE 0.12 % MT SOLN: 15.0000 mL | Freq: Once | OROMUCOSAL | Status: AC

## 2020-09-22 MED ORDER — STERILE WATER FOR IRRIGATION IR SOLN
Status: DC | PRN
  Administered 2020-09-22: 3000 mL

## 2020-09-22 MED ORDER — GEMTESA 75 MG PO TABS: 75.0000 mg | ORAL_TABLET | Freq: Every day | ORAL | 11 refills | Status: DC

## 2020-09-22 MED ORDER — PROPOFOL 10 MG/ML IV BOLUS
INTRAVENOUS | Status: AC
  Filled 2020-09-22: qty 20

## 2020-09-22 MED ORDER — ORAL CARE MOUTH RINSE
15.0000 mL | Freq: Once | OROMUCOSAL | Status: AC
  Administered 2020-09-22: 15 mL via OROMUCOSAL

## 2020-09-22 MED ORDER — DEXAMETHASONE SODIUM PHOSPHATE 10 MG/ML IJ SOLN
INTRAMUSCULAR | Status: DC | PRN
  Administered 2020-09-22: 4 mg via INTRAVENOUS

## 2020-09-22 SURGICAL SUPPLY — 12 items
BAG URO CATCHER STRL LF (MISCELLANEOUS) ×2 IMPLANT
CLOTH BEACON ORANGE TIMEOUT ST (SAFETY) ×2 IMPLANT
GLOVE SURG ENC TEXT LTX SZ7.5 (GLOVE) ×2 IMPLANT
GOWN STRL REUS W/TWL LRG LVL3 (GOWN DISPOSABLE) ×6 IMPLANT
GUIDEWIRE STR DUAL SENSOR (WIRE) ×2 IMPLANT
GUIDEWIRE ZIPWRE .038 STRAIGHT (WIRE) IMPLANT
KIT TURNOVER KIT A (KITS) ×2 IMPLANT
MANIFOLD NEPTUNE II (INSTRUMENTS) ×2 IMPLANT
PACK CYSTO (CUSTOM PROCEDURE TRAY) ×2 IMPLANT
STENT CONTOUR 8FR X 24 (STENTS) ×4 IMPLANT
TUBING CONNECTING 10 (TUBING) ×2 IMPLANT
TUBING UROLOGY SET (TUBING) IMPLANT

## 2020-09-22 NOTE — Anesthesia Procedure Notes (Signed)
Procedure Name: LMA Insertion Date/Time: 09/22/2020 2:25 PM Performed by: Mitzie Na, CRNA Pre-anesthesia Checklist: Patient identified, Emergency Drugs available, Suction available and Patient being monitored Patient Re-evaluated:Patient Re-evaluated prior to induction Oxygen Delivery Method: Circle system utilized Preoxygenation: Pre-oxygenation with 100% oxygen Induction Type: IV induction Ventilation: Mask ventilation without difficulty LMA: LMA with gastric port inserted LMA Size: 4.0 Tube type: Oral Number of attempts: 1 Placement Confirmation: positive ETCO2 and breath sounds checked- equal and bilateral Tube secured with: Tape Dental Injury: Teeth and Oropharynx as per pre-operative assessment

## 2020-09-22 NOTE — Anesthesia Preprocedure Evaluation (Signed)
Anesthesia Evaluation  Patient identified by MRN, date of birth, ID band Patient awake    Reviewed: Allergy & Precautions, NPO status , Patient's Chart, lab work & pertinent test results  History of Anesthesia Complications (+) history of anesthetic complications  Airway Mallampati: III  TM Distance: >3 FB Neck ROM: Full    Dental  (+) Chipped   Pulmonary asthma ,    Pulmonary exam normal breath sounds clear to auscultation       Cardiovascular negative cardio ROS   Rhythm:Regular Rate:Normal  ECG: NSR, rate 65   Neuro/Psych PSYCHIATRIC DISORDERS Anxiety negative neurological ROS     GI/Hepatic Neg liver ROS, GERD  ,  Endo/Other  negative endocrine ROS  Renal/GU CRFRenal disease Bladder dysfunction   Bladder cancer     Musculoskeletal negative musculoskeletal ROS (+)   Abdominal Normal abdominal exam  (+)   Peds  Hematology  (+) anemia ,  On eliquis    Anesthesia Other Findings bladder cancer on chemo   Reproductive/Obstetrics                             Anesthesia Physical  Anesthesia Plan  ASA: 3  Anesthesia Plan: General   Post-op Pain Management:    Induction: Intravenous  PONV Risk Score and Plan: 3 and Ondansetron, Dexamethasone, Treatment may vary due to age or medical condition and Midazolam  Airway Management Planned: LMA  Additional Equipment: None  Intra-op Plan:   Post-operative Plan: Extubation in OR  Informed Consent: I have reviewed the patients History and Physical, chart, labs and discussed the procedure including the risks, benefits and alternatives for the proposed anesthesia with the patient or authorized representative who has indicated his/her understanding and acceptance.     Dental advisory given  Plan Discussed with: CRNA  Anesthesia Plan Comments:         Anesthesia Quick Evaluation

## 2020-09-22 NOTE — Op Note (Signed)
Preoperative diagnosis:  Bilateral ureteral obstruction   Postoperative diagnosis:  Bilateral ureteral obstruction   Procedure:  Cystoscopy Bilateral ureteral stent placement (8 x 24 - no string)  Surgeon: Roxy Horseman, Brooke Bonito. M.D.  Anesthesia: General  Complications: None  Intraoperative findings: Her indwelling stents were mildly encrusted.  EBL: Minimal  Specimens: None  Indication: Alexandra Little is a 75 y.o. patient with ureteral obstruction. After reviewing the management options for treatment, he elected to proceed with the above surgical procedure(s). We have discussed the potential benefits and risks of the procedure, side effects of the proposed treatment, the likelihood of the patient achieving the goals of the procedure, and any potential problems that might occur during the procedure or recuperation. Informed consent has been obtained.  Description of procedure:  The patient was taken to the operating room and general anesthesia was induced.  The patient was placed in the dorsal lithotomy position, prepped and draped in the usual sterile fashion, and preoperative antibiotics were administered. A preoperative time-out was performed.   Cystourethroscopy was performed.  The patient's urethra was examined and was unremarkable. The bladder was then systematically examined in its entirety. There was no evidence for any bladder tumors, stones, or other mucosal pathology.    Attention then turned to the right ureteral orifice and the patient's indwelling ureteral stent was identified and brought out to the urethral meatus with the flexible graspers.  A 0.38 sensor guidewire was then advanced up the right ureter into the renal pelvis under fluoroscopic guidance.  The wire was then backloaded through the cystoscope and a ureteral stent was advance over the wire using Seldinger technique.  The stent was positioned appropriately under fluoroscopic and cystoscopic guidance.  The wire  was then removed with an adequate stent curl noted in the renal pelvis as well as in the bladder.  Attention then turned to the left ureteral orifice and the patient's indwelling ureteral stent was identified and brought out to the urethral meatus with the flexible graspers.  A 0.38 sensor guidewire was then advanced up the left ureter into the renal pelvis under fluoroscopic guidance.  The wire was then backloaded through the cystoscope and a ureteral stent was advance over the wire using Seldinger technique.  The stent was positioned appropriately under fluoroscopic and cystoscopic guidance.  The wire was then removed with an adequate stent curl noted in the renal pelvis as well as in the bladder.  The bladder was then emptied and the procedure ended.  The patient appeared to tolerate the procedure well and without complications.  The patient was able to be awakened and transferred to the recovery unit in satisfactory condition.    Pryor Curia MD

## 2020-09-22 NOTE — Transfer of Care (Signed)
Immediate Anesthesia Transfer of Care Note  Patient: Alexandra Little  Procedure(s) Performed: CYSTOSCOPY WITH STENT CHANGE (Bilateral)  Patient Location: PACU  Anesthesia Type:General  Level of Consciousness: awake, alert  and oriented  Airway & Oxygen Therapy: Patient Spontanous Breathing and Patient connected to face mask oxygen  Post-op Assessment: Report given to RN, Post -op Vital signs reviewed and stable and Patient moving all extremities  Post vital signs: Reviewed and stable  Last Vitals:  Vitals Value Taken Time  BP 111/52 09/22/20 1500  Temp 36.4 C 09/22/20 1453  Pulse 79 09/22/20 1505  Resp 8 09/22/20 1505  SpO2 97 % 09/22/20 1505  Vitals shown include unvalidated device data.  Last Pain:  Vitals:   09/22/20 1453  TempSrc:   PainSc: 0-No pain      Patients Stated Pain Goal: 3 (69/62/95 2841)  Complications: No notable events documented.

## 2020-09-22 NOTE — Discharge Instructions (Signed)

## 2020-09-23 ENCOUNTER — Encounter (HOSPITAL_COMMUNITY): Payer: Self-pay | Admitting: Urology

## 2020-09-24 ENCOUNTER — Inpatient Hospital Stay (HOSPITAL_BASED_OUTPATIENT_CLINIC_OR_DEPARTMENT_OTHER): Payer: Medicare Other | Admitting: Oncology

## 2020-09-24 ENCOUNTER — Inpatient Hospital Stay: Payer: Medicare Other | Attending: Oncology

## 2020-09-24 ENCOUNTER — Other Ambulatory Visit: Payer: Self-pay

## 2020-09-24 DIAGNOSIS — Z7901 Long term (current) use of anticoagulants: Secondary | ICD-10-CM | POA: Diagnosis not present

## 2020-09-24 DIAGNOSIS — C679 Malignant neoplasm of bladder, unspecified: Secondary | ICD-10-CM

## 2020-09-24 DIAGNOSIS — G629 Polyneuropathy, unspecified: Secondary | ICD-10-CM | POA: Insufficient documentation

## 2020-09-24 DIAGNOSIS — N1330 Unspecified hydronephrosis: Secondary | ICD-10-CM | POA: Insufficient documentation

## 2020-09-24 DIAGNOSIS — Z95828 Presence of other vascular implants and grafts: Secondary | ICD-10-CM

## 2020-09-24 DIAGNOSIS — Z86718 Personal history of other venous thrombosis and embolism: Secondary | ICD-10-CM | POA: Insufficient documentation

## 2020-09-24 LAB — CBC WITH DIFFERENTIAL (CANCER CENTER ONLY)
Abs Immature Granulocytes: 0.02 10*3/uL (ref 0.00–0.07)
Basophils Absolute: 0.1 10*3/uL (ref 0.0–0.1)
Basophils Relative: 1 %
Eosinophils Absolute: 0.2 10*3/uL (ref 0.0–0.5)
Eosinophils Relative: 4 %
HCT: 37.2 % (ref 36.0–46.0)
Hemoglobin: 12 g/dL (ref 12.0–15.0)
Immature Granulocytes: 0 %
Lymphocytes Relative: 23 %
Lymphs Abs: 1.5 10*3/uL (ref 0.7–4.0)
MCH: 30.9 pg (ref 26.0–34.0)
MCHC: 32.3 g/dL (ref 30.0–36.0)
MCV: 95.9 fL (ref 80.0–100.0)
Monocytes Absolute: 0.5 10*3/uL (ref 0.1–1.0)
Monocytes Relative: 8 %
Neutro Abs: 4.1 10*3/uL (ref 1.7–7.7)
Neutrophils Relative %: 64 %
Platelet Count: 192 10*3/uL (ref 150–400)
RBC: 3.88 MIL/uL (ref 3.87–5.11)
RDW: 13.8 % (ref 11.5–15.5)
WBC Count: 6.3 10*3/uL (ref 4.0–10.5)
nRBC: 0 % (ref 0.0–0.2)

## 2020-09-24 LAB — CMP (CANCER CENTER ONLY)
ALT: 6 U/L (ref 0–44)
AST: 17 U/L (ref 15–41)
Albumin: 3.6 g/dL (ref 3.5–5.0)
Alkaline Phosphatase: 117 U/L (ref 38–126)
Anion gap: 10 (ref 5–15)
BUN: 24 mg/dL — ABNORMAL HIGH (ref 8–23)
CO2: 24 mmol/L (ref 22–32)
Calcium: 9.3 mg/dL (ref 8.9–10.3)
Chloride: 108 mmol/L (ref 98–111)
Creatinine: 1.78 mg/dL — ABNORMAL HIGH (ref 0.44–1.00)
GFR, Estimated: 29 mL/min — ABNORMAL LOW (ref >60)
Glucose, Bld: 86 mg/dL (ref 70–99)
Potassium: 4.4 mmol/L (ref 3.5–5.1)
Sodium: 142 mmol/L (ref 135–145)
Total Bilirubin: 0.3 mg/dL (ref 0.3–1.2)
Total Protein: 7.2 g/dL (ref 6.5–8.1)

## 2020-09-24 MED ORDER — HEPARIN SOD (PORK) LOCK FLUSH 100 UNIT/ML IV SOLN
500.0000 [IU] | Freq: Once | INTRAVENOUS | Status: AC
  Administered 2020-09-24: 500 [IU]
  Filled 2020-09-24: qty 5

## 2020-09-24 MED ORDER — SODIUM CHLORIDE 0.9% FLUSH
10.0000 mL | Freq: Once | INTRAVENOUS | Status: AC
Start: 2020-09-24 — End: 2020-09-24
  Administered 2020-09-24: 10 mL
  Filled 2020-09-24: qty 10

## 2020-09-24 NOTE — Progress Notes (Signed)
Hematology and Oncology Follow Up Visit  Trinty Little [id number] 12/10/1945 75 y.o. 09/24/2020 11:38 AM Alexandra Little, MDMiller, [name], MD   Principle Diagnosis: 75 year old woman with bladder cancer diagnosed and March 2021.  She presented with stage IV high-grade urothelial carcinoma, FGFR mutation and pelvic adenopathy.   Prior Therapy:  She was treated with carboplatin and gemcitabine with the last infusion in March 2021.  She had severe cytopenias after 5 cycles of therapy and was switched to Avelumab in April 2021.    In July 2021 showed worsening of disease occluding retroperitoneal and inguinal adenopathy.  He subsequently was switched to [facility] in September 2021 therapy was poorly tolerated at day 1, 8 and 15 at 28-day cycle.  He subsequently was switched to 1 mg/kg day 1 day 15 out of 28-day cycle.    Last CT scan in February 2022 showed near complete response.  Current therapy: Padcev 1 mg/kg day 1 and day 15 of each cycle.  She received day 1 on July 17, 2020 and therapy has been on hold since that time.  Interim History: [name] is here for a follow-up evaluation.  Since the last visit, she reports overall feeling well without any major complaints.  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  She reports her writing has deteriorated but still able to walk and grip objects.  She denies any nausea, vomiting or abdominal pain.  Denies any recent hospitalization illnesses.  She did undergo stent replacement under the care of Dr. Alinda Money successfully.      Medications: updated on review. Current Outpatient Medications  Medication Sig Dispense Refill   acetaminophen (TYLENOL) 500 MG tablet Take 1,000 mg by mouth every 6 (six) hours as needed for mild pain or headache.     albuterol (VENTOLIN HFA) 108 (90 Base) MCG/ACT inhaler Inhale 2 puffs into the lungs every 4 (four) hours as needed for shortness of breath.      apixaban (ELIQUIS) 5 MG TABS tablet Take 5 mg by mouth 2 (two) times daily.     diazepam (VALIUM) 10 MG tablet Take 10 mg by mouth at bedtime as needed for sleep.     diphenhydrAMINE (BENADRYL) 25 mg capsule Take 25 mg by mouth every 6 (six) hours as needed for itching.     famotidine-calcium carbonate-magnesium hydroxide (PEPCID COMPLETE) 10-800-165 MG chewable tablet Chew 1 tablet by mouth 2 (two) times daily as needed (heartburn/indigestion.).     Meth-Hyo-M Bl-Na Phos-Ph Sal (URO-MP) 118 MG CAPS Take 1 capsule by mouth 3 (three) times daily as needed (urinary discomfort/pain).     Vibegron (GEMTESA) 75 MG TABS Take 75 mg by mouth daily. 30 tablet 11   No current facility-administered medications for this visit.     Allergies:  Allergies  Allergen Reactions   Lisinopril Anaphylaxis   Chlorhexidine Itching   Latex Itching   Salmon Oil [Nutritional Supplements] Swelling      Physical Exam:   There were no vitals taken for this visit.    ECOG: 1    General appearance: Alert, awake without any distress. Head: Atraumatic without abnormalities Oropharynx: Without any thrush or ulcers. Eyes: No scleral icterus. Lymph nodes: No lymphadenopathy noted in the cervical, supraclavicular, or axillary nodes Heart:regular rate and rhythm, without any murmurs or gallops.   Lung: Clear to auscultation without any rhonchi, wheezes or dullness to percussion. Abdomin: Soft, nontender without any shifting dullness or ascites. Musculoskeletal: No clubbing or  cyanosis. Neurological: No motor or sensory deficits. Skin: No rashes or lesions.         Lab Results: Lab Results  Component Value Date   WBC 6.1 08/28/2020   HGB 12.1 08/28/2020   HCT 38.4 08/28/2020   MCV 95.0 08/28/2020   PLT 237 08/28/2020     Chemistry      Component Value Date/Time   NA 141 08/28/2020 0815   K 4.6 08/28/2020 0815   CL 107 08/28/2020 0815   CO2 23 08/28/2020 0815   BUN 33 (H) 08/28/2020 0815    CREATININE 1.80 (H) 08/28/2020 0815      Component Value Date/Time   CALCIUM 9.5 08/28/2020 0815   ALKPHOS 105 08/28/2020 0815   AST 17 08/28/2020 0815   ALT <6 08/28/2020 0815   BILITOT 0.4 08/28/2020 0815        Impression and Plan:   75 year old woman with:   1.   Bladder cancer diagnosed in March 2021.  She presented with stage IV high-grade urothelial carcinoma with abdominal and pelvic adenopathy.  She has received Padcev that effectively and had a complete response to therapy at this time.  Risks and benefits of resuming Padcev given her neuropathy and complete response were discussed at this time.  Complications associated with worsening neuropathy including pain, interfering with function among others were reviewed.  After discussion today, we opted to continue to hold treatment for the time being and reassess in 4 weeks.  Potentially restarting Padcev at a lower dose on a monthly maintenance basis as a possibility.    2.  IV access: Port-A-Cath will continue to be accessed and in use for the time being.   3  Antiemetics: Compazine is available to her without any nausea or vomiting at this time.   4.  Prognosis and goals of care: Her disease is incurable although aggressive measures are warranted given her excellent response.  5.  Hydronephrosis: Managed by periodic stenting and monitored by Dr. Alinda Money.  6.  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  7.  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  She still has residual neuropathy and will continue to hold treatment.   8.  Follow-up: In 4 weeks for repeat evaluation.   30  minutes were dedicated to this visit.  Time was spent on reviewing laboratory data, disease status update and addressing complications related to cancer and cancer therapy.   Zola Button, MD 6/15/202211:38 AM

## 2020-09-25 NOTE — Anesthesia Postprocedure Evaluation (Signed)
Anesthesia Post Note  Patient: Alexandra Little  Procedure(s) Performed: CYSTOSCOPY WITH STENT CHANGE (Bilateral)     Patient location during evaluation: PACU Anesthesia Type: General Level of consciousness: awake Pain management: pain level controlled Vital Signs Assessment: post-procedure vital signs reviewed and stable Respiratory status: spontaneous breathing Cardiovascular status: stable Postop Assessment: no apparent nausea or vomiting Anesthetic complications: no   No notable events documented.  Last Vitals:  Vitals:   09/22/20 1515 09/22/20 1615  BP: (!) 107/55 128/65  Pulse: 77   Resp: 17 12  Temp:  36.7 C  SpO2: 100% 97%    Last Pain:  Vitals:   09/22/20 1615  TempSrc:   PainSc: 0-No pain                 Huston Foley

## 2020-10-06 ENCOUNTER — Encounter: Payer: Self-pay | Admitting: Oncology

## 2020-10-06 ENCOUNTER — Other Ambulatory Visit: Payer: Self-pay | Admitting: *Deleted

## 2020-10-06 DIAGNOSIS — C679 Malignant neoplasm of bladder, unspecified: Secondary | ICD-10-CM

## 2020-10-06 MED ORDER — APIXABAN 5 MG PO TABS: 5.0000 mg | ORAL_TABLET | Freq: Two times a day (BID) | ORAL | 3 refills | Status: DC

## 2020-10-22 ENCOUNTER — Inpatient Hospital Stay: Payer: Medicare Other

## 2020-10-22 ENCOUNTER — Inpatient Hospital Stay: Payer: Medicare Other | Attending: Oncology | Admitting: Oncology

## 2020-10-22 ENCOUNTER — Other Ambulatory Visit: Payer: Self-pay

## 2020-10-22 VITALS — BP 122/72 | HR 86 | Temp 98.6°F | Resp 17 | Ht 64.5 in | Wt 171.9 lb

## 2020-10-22 DIAGNOSIS — Z7901 Long term (current) use of anticoagulants: Secondary | ICD-10-CM | POA: Diagnosis not present

## 2020-10-22 DIAGNOSIS — C679 Malignant neoplasm of bladder, unspecified: Secondary | ICD-10-CM | POA: Insufficient documentation

## 2020-10-22 DIAGNOSIS — I82409 Acute embolism and thrombosis of unspecified deep veins of unspecified lower extremity: Secondary | ICD-10-CM | POA: Diagnosis not present

## 2020-10-22 DIAGNOSIS — N1330 Unspecified hydronephrosis: Secondary | ICD-10-CM | POA: Diagnosis not present

## 2020-10-22 DIAGNOSIS — G620 Drug-induced polyneuropathy: Secondary | ICD-10-CM | POA: Insufficient documentation

## 2020-10-22 DIAGNOSIS — T451X5A Adverse effect of antineoplastic and immunosuppressive drugs, initial encounter: Secondary | ICD-10-CM | POA: Insufficient documentation

## 2020-10-22 DIAGNOSIS — Z95828 Presence of other vascular implants and grafts: Secondary | ICD-10-CM

## 2020-10-22 LAB — CBC WITH DIFFERENTIAL (CANCER CENTER ONLY)
Abs Immature Granulocytes: 0.02 10*3/uL (ref 0.00–0.07)
Basophils Absolute: 0 10*3/uL (ref 0.0–0.1)
Basophils Relative: 1 %
Eosinophils Absolute: 0.3 10*3/uL (ref 0.0–0.5)
Eosinophils Relative: 4 %
HCT: 36.5 % (ref 36.0–46.0)
Hemoglobin: 12.3 g/dL (ref 12.0–15.0)
Immature Granulocytes: 0 %
Lymphocytes Relative: 19 %
Lymphs Abs: 1.3 10*3/uL (ref 0.7–4.0)
MCH: 31.1 pg (ref 26.0–34.0)
MCHC: 33.7 g/dL (ref 30.0–36.0)
MCV: 92.4 fL (ref 80.0–100.0)
Monocytes Absolute: 0.5 10*3/uL (ref 0.1–1.0)
Monocytes Relative: 8 %
Neutro Abs: 4.6 10*3/uL (ref 1.7–7.7)
Neutrophils Relative %: 68 %
Platelet Count: 217 10*3/uL (ref 150–400)
RBC: 3.95 MIL/uL (ref 3.87–5.11)
RDW: 12.4 % (ref 11.5–15.5)
WBC Count: 6.7 10*3/uL (ref 4.0–10.5)
nRBC: 0 % (ref 0.0–0.2)

## 2020-10-22 LAB — CMP (CANCER CENTER ONLY)
ALT: 6 U/L (ref 0–44)
AST: 18 U/L (ref 15–41)
Albumin: 3.4 g/dL — ABNORMAL LOW (ref 3.5–5.0)
Alkaline Phosphatase: 117 U/L (ref 38–126)
Anion gap: 9 (ref 5–15)
BUN: 30 mg/dL — ABNORMAL HIGH (ref 8–23)
CO2: 22 mmol/L (ref 22–32)
Calcium: 9.3 mg/dL (ref 8.9–10.3)
Chloride: 110 mmol/L (ref 98–111)
Creatinine: 1.93 mg/dL — ABNORMAL HIGH (ref 0.44–1.00)
GFR, Estimated: 27 mL/min — ABNORMAL LOW (ref >60)
Glucose, Bld: 96 mg/dL (ref 70–99)
Potassium: 4.4 mmol/L (ref 3.5–5.1)
Sodium: 141 mmol/L (ref 135–145)
Total Bilirubin: 0.3 mg/dL (ref 0.3–1.2)
Total Protein: 7.4 g/dL (ref 6.5–8.1)

## 2020-10-22 MED ORDER — HEPARIN SOD (PORK) LOCK FLUSH 100 UNIT/ML IV SOLN
500.0000 [IU] | Freq: Once | INTRAVENOUS | Status: AC
Start: 2020-10-22 — End: 2020-10-22
  Administered 2020-10-22: 500 [IU]
  Filled 2020-10-22: qty 5

## 2020-10-22 MED ORDER — SODIUM CHLORIDE 0.9% FLUSH
10.0000 mL | Freq: Once | INTRAVENOUS | Status: AC
  Administered 2020-10-22: 10 mL
  Filled 2020-10-22: qty 10

## 2020-10-22 NOTE — Progress Notes (Signed)
Hematology and Oncology Follow Up Visit  Alexandra Little [id number] Jan 17, 1946 75 y.o. 10/22/2020 2:51 PM [name], MDMiller, [name], MD   Principle Diagnosis: 75 year old woman with stage IV high-grade urothelial carcinoma of the bladder diagnosed in 2021.  She was found to have pelvic adenopathy and FGFR mutation.   Prior Therapy:  She was treated with carboplatin and gemcitabine with the last infusion in March 2021.  She had severe cytopenias after 5 cycles of therapy and was switched to Avelumab in April 2021.    In July 2021 showed worsening of disease occluding retroperitoneal and inguinal adenopathy.  He subsequently was switched to [facility] in September 2021 therapy was poorly tolerated at day 1, 8 and 15 at 28-day cycle.  He subsequently was switched to 1 mg/kg day 1 day 15 out of 28-day cycle.    Last CT scan in February 2022 showed near complete response.   Padcev 1 mg/kg day 1 and day 15 of each cycle.  She received day 1 on July 17, 2020 and therapy has been on hold since that time.   Current therapy: Active surveillance.  Interim History: Alexandra Little returns today for repeat evaluation.  Since her last visit, she reports no major changes in her health.  She denies any nausea, vomiting or abdominal pain.  He denies any worsening neuropathy.  She does have faint sensory neuropathy in her lower extremity which has not worsened but also did not resolve.  Stat interfering with function at this time.  She remains active and attends activities of daily living without any issues.      Medications: Reviewed and updated. Current Outpatient Medications  Medication Sig Dispense Refill   acetaminophen (TYLENOL) 500 MG tablet Take 1,000 mg by mouth every 6 (six) hours as needed for mild pain or headache.     albuterol (VENTOLIN HFA) 108 (90 Base) MCG/ACT inhaler Inhale 2 puffs into the lungs every 4 (four) hours as needed for shortness of breath.     apixaban (ELIQUIS) 5 MG TABS tablet  Take 1 tablet (5 mg total) by mouth 2 (two) times daily. 60 tablet 3   diazepam (VALIUM) 10 MG tablet Take 10 mg by mouth at bedtime as needed for sleep.     diphenhydrAMINE (BENADRYL) 25 mg capsule Take 25 mg by mouth every 6 (six) hours as needed for itching.     famotidine-calcium carbonate-magnesium hydroxide (PEPCID COMPLETE) 10-800-165 MG chewable tablet Chew 1 tablet by mouth 2 (two) times daily as needed (heartburn/indigestion.).     Meth-Hyo-M Bl-Na Phos-Ph Sal (URO-MP) 118 MG CAPS Take 1 capsule by mouth 3 (three) times daily as needed (urinary discomfort/pain).     Vibegron (GEMTESA) 75 MG TABS Take 75 mg by mouth daily. 30 tablet 11   No current facility-administered medications for this visit.     Allergies:  Allergies  Allergen Reactions   Lisinopril Anaphylaxis   Chlorhexidine Itching   Latex Itching   Salmon Oil [Nutritional Supplements] Swelling      Physical Exam:   Blood pressure 122/72, pulse 86, temperature 98.6 F (37 C), temperature source Oral, resp. rate 17, height 5' 4.5" (1.638 m), weight 171 lb 14.4 oz (78 kg), SpO2 97 %.     ECOG: 1    General appearance: Comfortable appearing without any discomfort Head: Normocephalic without any trauma Oropharynx: Mucous membranes are moist and pink without any thrush or ulcers. Eyes: Pupils are equal and round reactive to light. Lymph nodes: No cervical, supraclavicular, inguinal or axillary  lymphadenopathy.   Heart:regular rate and rhythm.  S1 and S2 without leg edema. Lung: Clear without any rhonchi or wheezes.  No dullness to percussion. Abdomin: Soft, nontender, nondistended with good bowel sounds.  No hepatosplenomegaly. Musculoskeletal: No joint deformity or effusion.  Full range of motion noted. Neurological: No deficits noted on motor, sensory and deep tendon reflex exam. Skin: No petechial rash or dryness.  Appeared moist.           Lab Results: Lab Results  Component Value Date   WBC  6.3 09/24/2020   HGB 12.0 09/24/2020   HCT 37.2 09/24/2020   MCV 95.9 09/24/2020   PLT 192 09/24/2020     Chemistry      Component Value Date/Time   NA 142 09/24/2020 1136   K 4.4 09/24/2020 1136   CL 108 09/24/2020 1136   CO2 24 09/24/2020 1136   BUN 24 (H) 09/24/2020 1136   CREATININE 1.78 (H) 09/24/2020 1136      Component Value Date/Time   CALCIUM 9.3 09/24/2020 1136   ALKPHOS 117 09/24/2020 1136   AST 17 09/24/2020 1136   ALT 6 09/24/2020 1136   BILITOT 0.3 09/24/2020 1136        Impression and Plan:   75 year old woman with:   1.   Stage IV high-grade urothelial carcinoma of the bladder diagnosed in 2021 with abdominal and pelvic adenopathy.  The natural course of this disease was updated at this time and treatment choices were discussed.  She had an excellent response to [facility] but developed peripheral neuropathy.  Risks and benefits of restarting this treatment the reduced dose and altered schedule for maintenance purposes versus continued observation were reviewed.  After discussion today, we will arrange for restaging scans in August 2022 and potentially start treatment based on these results.    2.  IV access: Port-A-Cath remains in place and will be flushed periodically.   3  Antiemetics: No nausea or vomiting reported while on treatment.  Compazine is available to her.   4.  Prognosis and goals of care: Her disease is incurable although aggressive measures are warranted given her excellent response.  5.  Hydronephrosis: Related to her malignancy with bilateral ureteral stents in place.  These are managed by Dr. Alinda Money.  6.  Thromboembolism: She is currently on Eliquis without any issues.  7.  Neuropathy: Chemotherapy related and mostly faint sensory and not interfering with function.  We will continue to monitor for the time being off treatment.   8.  Follow-up: In 4 weeks for repeat follow-up.   30  minutes were spent on this encounter.  Time was  dedicated to reviewing disease status, treatment choices and outlining future plan of care.   Zola Button, MD 7/13/20222:51 PM

## 2020-10-23 ENCOUNTER — Encounter: Payer: Self-pay | Admitting: Oncology

## 2020-10-23 ENCOUNTER — Other Ambulatory Visit: Payer: Self-pay | Admitting: Oncology

## 2020-10-23 MED ORDER — HYDROXYZINE HCL 10 MG PO TABS: 10.0000 mg | ORAL_TABLET | Freq: Four times a day (QID) | ORAL | 3 refills | Status: DC | PRN

## 2020-11-17 ENCOUNTER — Encounter: Payer: Self-pay | Admitting: Oncology

## 2020-11-18 ENCOUNTER — Other Ambulatory Visit: Payer: Self-pay

## 2020-11-18 ENCOUNTER — Encounter: Payer: Self-pay | Admitting: Oncology

## 2020-11-18 ENCOUNTER — Inpatient Hospital Stay: Payer: Medicare Other | Attending: Oncology

## 2020-11-18 DIAGNOSIS — C679 Malignant neoplasm of bladder, unspecified: Secondary | ICD-10-CM | POA: Diagnosis not present

## 2020-11-18 DIAGNOSIS — Z7901 Long term (current) use of anticoagulants: Secondary | ICD-10-CM | POA: Insufficient documentation

## 2020-11-18 DIAGNOSIS — I749 Embolism and thrombosis of unspecified artery: Secondary | ICD-10-CM | POA: Insufficient documentation

## 2020-11-18 DIAGNOSIS — G620 Drug-induced polyneuropathy: Secondary | ICD-10-CM | POA: Insufficient documentation

## 2020-11-18 DIAGNOSIS — T451X5A Adverse effect of antineoplastic and immunosuppressive drugs, initial encounter: Secondary | ICD-10-CM | POA: Diagnosis not present

## 2020-11-18 DIAGNOSIS — Z95828 Presence of other vascular implants and grafts: Secondary | ICD-10-CM

## 2020-11-18 LAB — CMP (CANCER CENTER ONLY)
ALT: 6 U/L (ref 0–44)
AST: 22 U/L (ref 15–41)
Albumin: 3.5 g/dL (ref 3.5–5.0)
Alkaline Phosphatase: 111 U/L (ref 38–126)
Anion gap: 7 (ref 5–15)
BUN: 19 mg/dL (ref 8–23)
CO2: 23 mmol/L (ref 22–32)
Calcium: 9.3 mg/dL (ref 8.9–10.3)
Chloride: 109 mmol/L (ref 98–111)
Creatinine: 1.67 mg/dL — ABNORMAL HIGH (ref 0.44–1.00)
GFR, Estimated: 32 mL/min — ABNORMAL LOW (ref >60)
Glucose, Bld: 95 mg/dL (ref 70–99)
Potassium: 4.6 mmol/L (ref 3.5–5.1)
Sodium: 139 mmol/L (ref 135–145)
Total Bilirubin: 0.4 mg/dL (ref 0.3–1.2)
Total Protein: 7.2 g/dL (ref 6.5–8.1)

## 2020-11-18 LAB — CBC WITH DIFFERENTIAL (CANCER CENTER ONLY)
Abs Immature Granulocytes: 0.03 10*3/uL (ref 0.00–0.07)
Basophils Absolute: 0.1 10*3/uL (ref 0.0–0.1)
Basophils Relative: 1 %
Eosinophils Absolute: 0.2 10*3/uL (ref 0.0–0.5)
Eosinophils Relative: 4 %
HCT: 34.6 % — ABNORMAL LOW (ref 36.0–46.0)
Hemoglobin: 11.5 g/dL — ABNORMAL LOW (ref 12.0–15.0)
Immature Granulocytes: 1 %
Lymphocytes Relative: 20 %
Lymphs Abs: 1.1 10*3/uL (ref 0.7–4.0)
MCH: 30.6 pg (ref 26.0–34.0)
MCHC: 33.2 g/dL (ref 30.0–36.0)
MCV: 92 fL (ref 80.0–100.0)
Monocytes Absolute: 0.5 10*3/uL (ref 0.1–1.0)
Monocytes Relative: 10 %
Neutro Abs: 3.4 10*3/uL (ref 1.7–7.7)
Neutrophils Relative %: 64 %
Platelet Count: 168 10*3/uL (ref 150–400)
RBC: 3.76 MIL/uL — ABNORMAL LOW (ref 3.87–5.11)
RDW: 12.8 % (ref 11.5–15.5)
WBC Count: 5.2 10*3/uL (ref 4.0–10.5)
nRBC: 0 % (ref 0.0–0.2)

## 2020-11-18 MED ORDER — SODIUM CHLORIDE 0.9% FLUSH
10.0000 mL | Freq: Once | INTRAVENOUS | Status: AC
  Administered 2020-11-18: 10 mL
  Filled 2020-11-18: qty 10

## 2020-11-18 MED ORDER — HEPARIN SOD (PORK) LOCK FLUSH 100 UNIT/ML IV SOLN
500.0000 [IU] | Freq: Once | INTRAVENOUS | Status: AC
  Administered 2020-11-18: 500 [IU]
  Filled 2020-11-18: qty 5

## 2020-11-20 ENCOUNTER — Encounter: Payer: Self-pay | Admitting: Oncology

## 2020-11-20 ENCOUNTER — Encounter: Payer: Self-pay | Admitting: *Deleted

## 2020-11-21 ENCOUNTER — Telehealth: Payer: Self-pay | Admitting: Oncology

## 2020-11-21 ENCOUNTER — Ambulatory Visit (HOSPITAL_COMMUNITY)
Admission: RE | Admit: 2020-11-21 | Discharge: 2020-11-21 | Disposition: A | Discharge status: Home / Self Care | Payer: Medicare Other | Source: Ambulatory Visit | Attending: Oncology | Admitting: Oncology

## 2020-11-21 ENCOUNTER — Other Ambulatory Visit: Payer: Self-pay

## 2020-11-21 DIAGNOSIS — N3289 Other specified disorders of bladder: Secondary | ICD-10-CM | POA: Diagnosis not present

## 2020-11-21 DIAGNOSIS — C679 Malignant neoplasm of bladder, unspecified: Secondary | ICD-10-CM

## 2020-11-21 DIAGNOSIS — N1330 Unspecified hydronephrosis: Secondary | ICD-10-CM | POA: Diagnosis not present

## 2020-11-21 DIAGNOSIS — I700 Atherosclerosis of aorta: Secondary | ICD-10-CM | POA: Diagnosis not present

## 2020-11-21 DIAGNOSIS — C801 Malignant (primary) neoplasm, unspecified: Secondary | ICD-10-CM | POA: Diagnosis not present

## 2020-11-21 NOTE — Telephone Encounter (Signed)
Sch per 7/13 los, pt aware 

## 2020-11-25 ENCOUNTER — Inpatient Hospital Stay: Payer: Medicare Other | Admitting: Oncology

## 2020-11-25 ENCOUNTER — Other Ambulatory Visit: Payer: Self-pay

## 2020-11-25 VITALS — BP 129/72 | HR 80 | Temp 97.8°F | Resp 17 | Ht 64.5 in | Wt 178.4 lb

## 2020-11-25 DIAGNOSIS — I749 Embolism and thrombosis of unspecified artery: Secondary | ICD-10-CM | POA: Diagnosis not present

## 2020-11-25 DIAGNOSIS — C679 Malignant neoplasm of bladder, unspecified: Secondary | ICD-10-CM

## 2020-11-25 DIAGNOSIS — Z7901 Long term (current) use of anticoagulants: Secondary | ICD-10-CM | POA: Diagnosis not present

## 2020-11-25 DIAGNOSIS — T451X5A Adverse effect of antineoplastic and immunosuppressive drugs, initial encounter: Secondary | ICD-10-CM | POA: Diagnosis not present

## 2020-11-25 DIAGNOSIS — G620 Drug-induced polyneuropathy: Secondary | ICD-10-CM | POA: Diagnosis not present

## 2020-11-25 NOTE — Progress Notes (Signed)
Hematology and Oncology Follow Up Visit  Alexandra Little [id number] 05/07/1945 74 y.o. 11/25/2020 3:10 PM [name], MDMiller, [name], MD   Principle Diagnosis: 75 year old woman with bladder cancer diagnosed and 2021.  She was found to have stage IV high-grade urothelial carcinoma and has achieved a complete response on systemic therapy.  Prior Therapy:  She was treated with carboplatin and gemcitabine with the last infusion in March 2021.  She had severe cytopenias after 5 cycles of therapy and was switched to Avelumab in April 2021.    In July 2021 showed worsening of disease occluding retroperitoneal and inguinal adenopathy.  He subsequently was switched to [facility] in September 2021 therapy was poorly tolerated at day 1, 8 and 15 at 28-day cycle.  He subsequently was switched to 1 mg/kg day 1 day 15 out of 28-day cycle.    Last CT scan in February 2022 showed near complete response.   Padcev 1 mg/kg day 1 and day 15 of each cycle.  She received day 1 on July 17, 2020 and therapy has been on hold since that time.   Current therapy: Active surveillance.  Interim History: Alexandra Little presents today for a follow-up visit.  Since her last visit, she reports no major changes in her health.  Her peripheral neuropathy continues to improve although has not resolved completely.  She does have follow-up minor sensory neuropathy in her lower extremities but not in her upper extremities.  He denies any nausea, vomiting or abdominal pain.  She denies any recent hospitalizations or illnesses.      Medications: Updated on review. Current Outpatient Medications  Medication Sig Dispense Refill   acetaminophen (TYLENOL) 500 MG tablet Take 1,000 mg by mouth every 6 (six) hours as needed for mild pain or headache.     albuterol (VENTOLIN HFA) 108 (90 Base) MCG/ACT inhaler Inhale 2 puffs into the lungs every 4 (four) hours as needed for shortness of breath.     apixaban (ELIQUIS) 5 MG TABS tablet Take 1  tablet (5 mg total) by mouth 2 (two) times daily. 60 tablet 3   diazepam (VALIUM) 10 MG tablet Take 10 mg by mouth at bedtime as needed for sleep.     diphenhydrAMINE (BENADRYL) 25 mg capsule Take 25 mg by mouth every 6 (six) hours as needed for itching.     famotidine-calcium carbonate-magnesium hydroxide (PEPCID COMPLETE) 10-800-165 MG chewable tablet Chew 1 tablet by mouth 2 (two) times daily as needed (heartburn/indigestion.).     hydrOXYzine (ATARAX/VISTARIL) 10 MG tablet Take 1 tablet (10 mg total) by mouth every 6 (six) hours as needed. 60 tablet 3   Meth-Hyo-M Bl-Na Phos-Ph Sal (URO-MP) 118 MG CAPS Take 1 capsule by mouth 3 (three) times daily as needed (urinary discomfort/pain).     Vibegron (GEMTESA) 75 MG TABS Take 75 mg by mouth daily. 30 tablet 11   No current facility-administered medications for this visit.     Allergies:  Allergies  Allergen Reactions   Lisinopril Anaphylaxis   Chlorhexidine Itching   Iodine Swelling    Patient states she either breaks out in hives or her tongue swells   Latex Itching   Salmon Oil [Nutritional Supplements] Swelling      Physical Exam:     Blood pressure 129/72, pulse 80, temperature 97.8 F (36.6 C), temperature source Oral, resp. rate 17, height 5' 4.5" (1.638 m), weight 178 lb 6.4 oz (80.9 kg), SpO2 100 %.    ECOG: 1    General appearance: Alert,  awake without any distress. Head: Atraumatic without abnormalities Oropharynx: Without any thrush or ulcers. Eyes: No scleral icterus. Lymph nodes: No lymphadenopathy noted in the cervical, supraclavicular, or axillary nodes Heart:regular rate and rhythm, without any murmurs or gallops.   Lung: Clear to auscultation without any rhonchi, wheezes or dullness to percussion. Abdomin: Soft, nontender without any shifting dullness or ascites. Musculoskeletal: No clubbing or cyanosis. Neurological: No motor or sensory deficits. Skin: No rashes or lesions.          Lab  Results: Lab Results  Component Value Date   WBC 5.2 11/18/2020   HGB 11.5 (L) 11/18/2020   HCT 34.6 (L) 11/18/2020   MCV 92.0 11/18/2020   PLT 168 11/18/2020     Chemistry      Component Value Date/Time   NA 139 11/18/2020 1033   K 4.6 11/18/2020 1033   CL 109 11/18/2020 1033   CO2 23 11/18/2020 1033   BUN 19 11/18/2020 1033   CREATININE 1.67 (H) 11/18/2020 1033      Component Value Date/Time   CALCIUM 9.3 11/18/2020 1033   ALKPHOS 111 11/18/2020 1033   AST 22 11/18/2020 1033   ALT 6 11/18/2020 1033   BILITOT 0.4 11/18/2020 1033      IMPRESSION: 1. Stable exam. No new or progressive findings to suggest progressive disease in the chest, abdomen, or pelvis. 2. Similar appearance bilateral hydroureteronephrosis with bilateral internal ureteral stents in situ. 3. Similar appearance of bladder wall thickening with perivesical stranding. 1.3 cm low attenuating nodular focus anterior to the bladder is similar to prior. Continued attention on follow-up recommended. 4. Tiny hiatal hernia. 5. Possible cirrhosis. 6. Aortic Atherosclerosis (ICD10-I70.0).  Impression and Plan:   75 year old woman with:   1.   Bladder cancer diagnosed in 2021.  She presented with stage IV high-grade urothelial carcinoma with adenopathy.   CT scan obtained on November 21, 2020 was personally reviewed and continues to show complete response to therapy. Treatment options were discussed at this time.  Restarting Padcev versus continued active surveillance versus switching to a different therapy were reiterated at this time.  Given her excellent response to therapy and no evidence of residual disease and in the setting of residual neuropathy of opted to continue with close surveillance.  Resuming Padcev or different salvage therapy will be indicated if she developed relapsed disease.  We will update her staging scans in 3 months.    2.  IV access: Port-A-Cath continues to be in place and flushed  periodically.   3  Antiemetics: No delayed nausea at this time with Compazine is available to her as needed.   4.  Prognosis and goals of care: Despite her excellent response therapy remains palliative at this time.  5.  Hydronephrosis: No evidence of worsening hydronephrosis at this time.  With bilateral stents in place.  Followed by Dr. Alinda Money.  6.  Thromboembolism: No bleeding or thrombosis noted at this time.  She continues to be on Eliquis.  7.  Neuropathy: Chemotherapy related.   8.  Follow-up: In 6 weeks for Port-A-Cath flush and repeat CT scan in 3 months.   30  minutes were dedicated to this visit.  Time spent on reviewing CT scan results, disease status update, treatment choices and future plan of care discussion.   Zola Button, MD 8/16/20223:10 PM

## 2020-12-02 DIAGNOSIS — N131 Hydronephrosis with ureteral stricture, not elsewhere classified: Secondary | ICD-10-CM | POA: Diagnosis not present

## 2020-12-02 DIAGNOSIS — Z8551 Personal history of malignant neoplasm of bladder: Secondary | ICD-10-CM | POA: Diagnosis not present

## 2020-12-18 ENCOUNTER — Other Ambulatory Visit: Payer: Self-pay | Admitting: Urology

## 2020-12-22 ENCOUNTER — Encounter: Payer: Self-pay | Admitting: Oncology

## 2020-12-23 ENCOUNTER — Encounter (HOSPITAL_COMMUNITY): Payer: Self-pay

## 2020-12-30 ENCOUNTER — Other Ambulatory Visit: Payer: Self-pay | Admitting: Urology

## 2020-12-30 NOTE — Patient Instructions (Addendum)
DUE TO COVID-19 ONLY ONE VISITOR IS ALLOWED TO COME WITH YOU AND STAY IN THE WAITING ROOM ONLY DURING PRE OP AND PROCEDURE.   **NO VISITORS ARE ALLOWED IN THE SHORT STAY AREA OR RECOVERY ROOM!!**        Your procedure is scheduled on:  Thursday, 01-08-21   Report to [facility] Main  Entrance    Report to admitting at 7:15 AM   Call this number if you have problems the morning of surgery [phone]   Do not eat food :After Midnight.   May have liquids until 6:30 AM day of surgery  CLEAR LIQUID DIET  Foods Allowed                                                                     Foods Excluded  Water, Black Coffee (no milk/no creamer) and tea, regular and decaf                              liquids that you cannot  Plain Jell-O in any flavor  (No red)                         see through such as: Fruit ices (not with fruit pulp)                                 milk, soups, orange juice  Iced Popsicles (No red)                                    All solid food                             Apple juices Sports drinks like Gatorade (No red) Lightly seasoned clear broth or consume(fat free) Sugar    Oral Hygiene is also important to reduce your risk of infection.                                    Remember - BRUSH YOUR TEETH THE MORNING OF SURGERY WITH YOUR REGULAR TOOTHPASTE   Do NOT smoke after Midnight   Take these medicines the morning of surgery with A SIP OF WATER:  Acetaminophen, Albuterol inhaler, Hydroxyzine, Myrbetriq, Pepcid                  Stop all vitamins and herbal supplements a week before surgery             You may not have any metal on your body including hair pins, jewelry, and body piercing             Do not wear make-up, lotions, powders, perfumes or deodorant  Do not wear nail polish including gel and S&S, artificial/acrylic nails, or any other type of covering on natural nails including finger and toenails. If you have artificial nails, gel  coating, etc. that needs to be removed by a  nail salon please have this removed prior to surgery or surgery may need to be canceled/ delayed if the surgeon/ anesthesia feels like they are unable to be safely monitored.   Do not shave  48 hours prior to surgery.      Do not bring valuables to the hospital. [place].   Contacts, dentures or bridgework may not be worn into surgery.    Patients discharged the day of surgery will not be allowed to drive home.  Please read over the following fact sheets you were given: IF YOU HAVE QUESTIONS ABOUT YOUR PRE OP INSTRUCTIONS PLEASE CALL [place] - Preparing for Surgery Before surgery, you can play an important role.  Because skin is not sterile, your skin needs to be as free of germs as possible.  You can reduce the number of germs on your skin by washing with an antibacterial soap before surgery.  Do not shave (including legs and underarms) for at least 48 hours prior to surgery.  You may shave your face/neck.  Please follow these instructions carefully:  1.  Shower with an antibacterial soap the night before surgery and the  morning of surgery.  2.  If you choose to wash your hair, wash your hair first as usual with your normal  shampoo.  3.  After you shampoo, rinse your hair and body thoroughly to remove the shampoo.                             4.  Wash thoroughly, paying special attention to the area where your  surgery  will be performed.  5.  Thoroughly rinse your body with warm water from the neck down.  6.   Pat yourself dry with a clean towel.             7.  Wear clean pajamas.             8.  Place clean sheets on your bed the night of your first shower and do not  sleep with pets.  Day of Surgery : Do not apply any lotions/deodorants the morning of surgery.  Please wear clean clothes to the hospital/surgery center.  FAILURE TO FOLLOW THESE INSTRUCTIONS MAY RESULT IN THE  CANCELLATION OF YOUR SURGERY  PATIENT SIGNATURE_________________________________  NURSE SIGNATURE__________________________________  ________________________________________________________________________

## 2020-12-30 NOTE — Progress Notes (Addendum)
COVID swab appointment: N/A  COVID Vaccine Completed:  Yes x4 Date COVID Vaccine completed: 06-11-19, 07-12-19 Has received booster: Yes x2 12-12-19 COVID vaccine manufacturer:  Moderna      Date of COVID positive in last 90 days:  No  PCP - [name], MD Cardiologist - No  Chest x-ray -  CT chest 11-24-20 Epic EKG - 06-20-20 Epic Stress Test - N/A ECHO - N/A Cardiac Cath - N/A Pacemaker/ICD device last checked: Spinal Cord Stimulator:  Sleep Study - No CPAP -   Fasting Blood Sugar - No Checks Blood Sugar _____ times a day  Blood Thinner Instructions:  Eliquis.  Patient states that she was told to stay on. Aspirin Instructions: Last Dose:  Activity level:  Can go up a flight of stairs and perform activities of daily living without stopping and without symptoms of chest pain or shortness of breath.  Able to exercise without symptoms  Anesthesia review:   BP 87/76 at PAT.  Patient denies symptoms of dizziness.   Discussed with Lyndon Code, PA-C who recommended that patient monitor, push fluids and watch for symptoms of dizziness.  If BP remains low patient will notify PCP.  Patient denies shortness of breath, fever, cough and chest pain at PAT appointment   Patient verbalized understanding of instructions that were given to them at the PAT appointment. Patient was also instructed that they will need to review over the PAT instructions again at home before surgery.

## 2020-12-31 ENCOUNTER — Other Ambulatory Visit: Payer: Self-pay

## 2020-12-31 ENCOUNTER — Encounter (HOSPITAL_COMMUNITY): Payer: Self-pay

## 2020-12-31 ENCOUNTER — Encounter (HOSPITAL_COMMUNITY)
Admission: RE | Admit: 2020-12-31 | Discharge: 2020-12-31 | Disposition: A | Discharge status: Home / Self Care | Payer: Medicare Other | Source: Ambulatory Visit | Attending: Urology | Admitting: Urology

## 2021-01-06 ENCOUNTER — Inpatient Hospital Stay: Payer: Medicare Other | Attending: Oncology

## 2021-01-06 ENCOUNTER — Inpatient Hospital Stay (HOSPITAL_BASED_OUTPATIENT_CLINIC_OR_DEPARTMENT_OTHER): Payer: Medicare Other | Admitting: Oncology

## 2021-01-06 ENCOUNTER — Other Ambulatory Visit: Payer: Self-pay

## 2021-01-06 VITALS — BP 110/80 | HR 88 | Temp 97.5°F | Resp 16 | Ht 64.0 in | Wt 179.7 lb

## 2021-01-06 DIAGNOSIS — G620 Drug-induced polyneuropathy: Secondary | ICD-10-CM | POA: Diagnosis not present

## 2021-01-06 DIAGNOSIS — Z7901 Long term (current) use of anticoagulants: Secondary | ICD-10-CM | POA: Diagnosis not present

## 2021-01-06 DIAGNOSIS — N1330 Unspecified hydronephrosis: Secondary | ICD-10-CM | POA: Insufficient documentation

## 2021-01-06 DIAGNOSIS — C679 Malignant neoplasm of bladder, unspecified: Secondary | ICD-10-CM

## 2021-01-06 DIAGNOSIS — Z86718 Personal history of other venous thrombosis and embolism: Secondary | ICD-10-CM | POA: Diagnosis not present

## 2021-01-06 DIAGNOSIS — T451X5A Adverse effect of antineoplastic and immunosuppressive drugs, initial encounter: Secondary | ICD-10-CM | POA: Diagnosis not present

## 2021-01-06 DIAGNOSIS — Z95828 Presence of other vascular implants and grafts: Secondary | ICD-10-CM

## 2021-01-06 LAB — CBC WITH DIFFERENTIAL (CANCER CENTER ONLY)
Abs Immature Granulocytes: 0.05 10*3/uL (ref 0.00–0.07)
Basophils Absolute: 0.1 10*3/uL (ref 0.0–0.1)
Basophils Relative: 1 %
Eosinophils Absolute: 0.3 10*3/uL (ref 0.0–0.5)
Eosinophils Relative: 5 %
HCT: 35.1 % — ABNORMAL LOW (ref 36.0–46.0)
Hemoglobin: 11.4 g/dL — ABNORMAL LOW (ref 12.0–15.0)
Immature Granulocytes: 1 %
Lymphocytes Relative: 18 %
Lymphs Abs: 1 10*3/uL (ref 0.7–4.0)
MCH: 30.1 pg (ref 26.0–34.0)
MCHC: 32.5 g/dL (ref 30.0–36.0)
MCV: 92.6 fL (ref 80.0–100.0)
Monocytes Absolute: 0.5 10*3/uL (ref 0.1–1.0)
Monocytes Relative: 9 %
Neutro Abs: 3.8 10*3/uL (ref 1.7–7.7)
Neutrophils Relative %: 66 %
Platelet Count: 224 10*3/uL (ref 150–400)
RBC: 3.79 MIL/uL — ABNORMAL LOW (ref 3.87–5.11)
RDW: 13.2 % (ref 11.5–15.5)
WBC Count: 5.7 10*3/uL (ref 4.0–10.5)
nRBC: 0 % (ref 0.0–0.2)

## 2021-01-06 LAB — CMP (CANCER CENTER ONLY)
ALT: <6 U/L (ref 0–44)
AST: 13 U/L — ABNORMAL LOW (ref 15–41)
Albumin: 3.5 g/dL (ref 3.5–5.0)
Alkaline Phosphatase: 116 U/L (ref 38–126)
Anion gap: 12 (ref 5–15)
BUN: 29 mg/dL — ABNORMAL HIGH (ref 8–23)
CO2: 21 mmol/L — ABNORMAL LOW (ref 22–32)
Calcium: 9.5 mg/dL (ref 8.9–10.3)
Chloride: 108 mmol/L (ref 98–111)
Creatinine: 1.8 mg/dL — ABNORMAL HIGH (ref 0.44–1.00)
GFR, Estimated: 29 mL/min — ABNORMAL LOW (ref >60)
Glucose, Bld: 93 mg/dL (ref 70–99)
Potassium: 4.7 mmol/L (ref 3.5–5.1)
Sodium: 141 mmol/L (ref 135–145)
Total Bilirubin: 0.4 mg/dL (ref 0.3–1.2)
Total Protein: 7.6 g/dL (ref 6.5–8.1)

## 2021-01-06 MED ORDER — HEPARIN SOD (PORK) LOCK FLUSH 100 UNIT/ML IV SOLN
500.0000 [IU] | Freq: Once | INTRAVENOUS | Status: AC
Start: 2021-01-06 — End: 2021-01-06
  Administered 2021-01-06: 500 [IU]

## 2021-01-06 MED ORDER — SODIUM CHLORIDE 0.9% FLUSH
10.0000 mL | Freq: Once | INTRAVENOUS | Status: AC
  Administered 2021-01-06: 10 mL

## 2021-01-06 NOTE — Progress Notes (Signed)
Creatinine today 1.80. Results routed to Dr. Alinda Money.

## 2021-01-06 NOTE — Progress Notes (Signed)
Hematology and Oncology Follow Up Visit  Alexandra Little [id number] 28-Aug-1945 75 y.o. 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  She was found to have adenopathy in the FGFR positive tumor.  Prior Therapy:  She was treated with carboplatin and gemcitabine with the last infusion in March 2021.  She had severe cytopenias after 5 cycles of therapy and was switched to Avelumab in April 2021.    In July 2021 showed worsening of disease occluding retroperitoneal and inguinal adenopathy.  He subsequently was switched to [facility] in September 2021 therapy was poorly tolerated at day 1, 8 and 15 at 28-day cycle.  He subsequently was switched to 1 mg/kg day 1 day 15 out of 28-day cycle.    Last CT scan in February 2022 showed near complete response.   Padcev 1 mg/kg day 1 and day 15 of each cycle.  She received day 1 on July 17, 2020 and therapy has been on hold due to neuropathy and complete response.   Current therapy: Active surveillance.  Interim History: Alexandra Little returns today for repeat evaluation.  Since the last visit, she reports feeling well without any major complaints.  She has reported improvement in her sensory neuropathy in lower extremities with very faint sensation left in her toes.  She denies any functional decline or weakness.  She is scheduled to travel on a cruise without any recent issues.  She denies any nausea, vomiting or abdominal pain.  She does report urinary frequency.      Medications: Reviewed without changes. Current Outpatient Medications  Medication Sig Dispense Refill   acetaminophen (TYLENOL) 500 MG tablet Take 1,000 mg by mouth every 6 (six) hours as needed for mild pain or headache.     albuterol (VENTOLIN HFA) 108 (90 Base) MCG/ACT inhaler Inhale 2 puffs into the lungs every 4 (four) hours as needed for shortness of breath.      apixaban (ELIQUIS) 5 MG TABS tablet Take 1 tablet (5 mg total) by mouth 2 (two) times daily. 60 tablet 3   diphenhydrAMINE (BENADRYL) 25 mg capsule Take 25 mg by mouth every 6 (six) hours as needed for itching.     famotidine-calcium carbonate-magnesium hydroxide (PEPCID COMPLETE) 10-800-165 MG chewable tablet Chew 1 tablet by mouth 2 (two) times daily as needed (heartburn/indigestion.).     hydrOXYzine (ATARAX/VISTARIL) 10 MG tablet Take 1 tablet (10 mg total) by mouth every 6 (six) hours as needed. 60 tablet 3   MYRBETRIQ 50 MG TB24 tablet Take 50 mg by mouth daily.     No current facility-administered medications for this visit.     Allergies:  Allergies  Allergen Reactions   Lisinopril Anaphylaxis   Chlorhexidine Itching   Iodine Swelling    Patient states she either breaks out in hives or her tongue swells   Latex Itching   Salmon Oil [Nutritional Supplements] Swelling      Physical Exam:     Blood pressure 110/80, pulse 88, temperature (!) 97.5 F (36.4 C), temperature source Oral, resp. rate 16, height 5\' 4"  (1.626 m), weight 179 lb 11.2 oz (81.5 kg), SpO2 100 %.     ECOG: 1    General appearance: Comfortable appearing without any discomfort Head: Normocephalic without any trauma Oropharynx: Mucous membranes are moist and pink without any thrush or ulcers. Eyes: Pupils are equal and round reactive to light. Lymph nodes: No cervical,  supraclavicular, inguinal or axillary lymphadenopathy.   Heart:regular rate and rhythm.  S1 and S2 without leg edema. Lung: Clear without any rhonchi or wheezes.  No dullness to percussion. Abdomin: Soft, nontender, nondistended with good bowel sounds.  No hepatosplenomegaly. Musculoskeletal: No joint deformity or effusion.  Full range of motion noted. Neurological: No deficits noted on motor, sensory and deep tendon reflex exam. Skin: No petechial rash or dryness.  Appeared moist.           Lab Results: Lab Results   Component Value Date   WBC 5.2 11/18/2020   HGB 11.5 (L) 11/18/2020   HCT 34.6 (L) 11/18/2020   MCV 92.0 11/18/2020   PLT 168 11/18/2020     Chemistry      Component Value Date/Time   NA 139 11/18/2020 1033   K 4.6 11/18/2020 1033   CL 109 11/18/2020 1033   CO2 23 11/18/2020 1033   BUN 19 11/18/2020 1033   CREATININE 1.67 (H) 11/18/2020 1033      Component Value Date/Time   CALCIUM 9.3 11/18/2020 1033   ALKPHOS 111 11/18/2020 1033   AST 22 11/18/2020 1033   ALT 6 11/18/2020 1033   BILITOT 0.4 11/18/2020 1033       Impression and Plan:   75 year old woman with:   1.   Stage IV high-grade urothelial carcinoma of the bladder with pelvic adenopathy diagnosed in 2021.  She is currently on active surveillance after developing a complete response to therapy as well as neuropathy associated with treatment.  Risks and benefits of continuing active surveillance versus restarting systemic treatment were reviewed these options including restarting Padcev at a lower dose and frequency versus different salvage therapy options.  She is agreeable to continue with this plan and we will update her staging scans before the next visit.    2.  IV access: Port-A-Cath remains in place and will be flushed periodically.   3  Antiemetics: Compazine is available to her without any residual nausea or vomiting.   4.  Prognosis and goals of care: Aggressive measures are warranted at this time although her disease might not be curable.  5.  Hydronephrosis: She continues to follow with Dr. Alinda Money and stent exchange scheduled for this week.  6.  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  7.  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  Continues to improve at this time.   8.  Follow-up: She will return in the next 6 weeks for repeat evaluation and imaging studies.   30  minutes were dedicated to this encounter.  Time spent on reviewing laboratory data, disease  status update, treatment choices and future plan of care discussion.   Zola Button, MD 9/27/20228:22 AM

## 2021-01-07 NOTE — H&P (Signed)
1. Metastatic urothelial carcinoma the bladder  2. Bilateral ureteral obstruction   Alexandra Little returns today for ongoing management of her bilateral ureteral obstruction. She was recently seen by Dr. Alen Blew and her imaging studies suggested that she remains in complete remission at this time. She has remained off therapy since late spring and continues on observation at this time with plans to repeat staging studies in 3 months. Her most recent serum creatinine was 1.67. She continues to have significant incontinence which has been stable. She has had some mild benefit from taking Myrbetriq 50 mg. She also has used Uribel prn for dysuria. She states that she does have more irritation despite use of her barrier creams and wonders if this might be recurrent fungal infection.     ALLERGIES: Latex Lisinopril Nutritional Supplement [place]    MEDICATIONS: Myrbetriq 50 mg tablet, extended release 24 hr 1 tablet PO Daily  Eliquis 5 mg tablet  Nystatin 100,000 unit/gram powder Apply to affected area tid for up to 2 weeks  Uribel 118 mg-10 mg-40.8 mg-36 mg-0.12 mg capsule 1 capsule PO Q 8 H As Directed     GU PSH: Cystoscopy Insert Stent, Bilateral - 09/22/2020, Bilateral - 06/20/2020     NON-GU PSH: No Non-GU PSH    GU PMH: Bladder Cancer overlapping sites - 08/13/2020, - 06/18/2020 Ureteral obstruction - 08/13/2020, - 06/18/2020 History of bladder cancer Kidney Failure Unspec      PMH Notes:   1) Metastatic urothelial cancer of the bladder: Alexandra Little relocated from [place] to [place] in March 2022 to be closer to her daughter, Alexandra Little. She was treated with systemic therapy at [facility]. Due to her chronic kidney disease, she was not a candidate for cisplatin chemotherapy and was treated with carboplatin and gemcitabine. She did not tolerate this well and has subsequently been on multiple immunotherapy ease. When she presented to me, she was on a alternate dose of Padcev and has been  doing fairly well with this. She has had a complete response based on imaging and continues on this therapy and has established care with Dr. Alen Blew.   2) Bilateral ureteral obstructionL In October of 2020 when she was diagnosed, she was also noted to have ureteral obstruction and underwent bilateral nephrostomy tube placement. She recently had internalization of bilateral ureteral stents and removal of her nephrostomy tubes. She seems to have tolerated this quite well. Her serum creatinine a few weeks ago was 1.4 and this was felt to be very consistent with her baseline possibly better than her baseline which has hovered around 1.6. She has been tolerating her stents relatively well. Her Cr was elevated over 3 when she presented to me but declined after I changed and upsized her stents in March 2022.   Mar 2022: Bilateral ureteral stent change (upsized to 8 x 24)  Jun 2022: Bilateral ureteral stent change (8 x 24 ) - mild encrustation   3) Incontinence: She does have chronic urinary incontinence and uses approximately 4 pads per day. However, this is not significantly worse since her ureteral stents were placed. She describes her incontinence as unconscious. It is unclear whether she may have some stress related incontinence and urge related incontinence. She is unable to provide an accurate history in this regard. She does have some significant vaginal in skin irritation related to her pads and incontinence.   Current treatment: Myrbetriq 50 mg   NON-GU PMH: Asthma DVT, History    FAMILY HISTORY: 1 Daughter -  Runs in Family   SOCIAL HISTORY: Marital Status: Unknown Preferred Language: English; Ethnicity: Not Hispanic Or Latino; Race: White Current Smoking Status: Patient has never smoked.   Tobacco Use Assessment Completed: Used Tobacco in last 30 days? Does drink.  Drinks 2 caffeinated drinks per day.    REVIEW OF SYSTEMS:    GU Review Female:   Patient denies frequent urination, hard  to postpone urination, burning /pain with urination, get up at night to urinate, leakage of urine, stream starts and stops, trouble starting your stream, have to strain to urinate, and currently pregnant.  Gastrointestinal (Lower):   Patient denies diarrhea and constipation.  Gastrointestinal (Upper):   Patient denies nausea and vomiting.  Constitutional:   Patient denies fever, night sweats, weight loss, and fatigue.  Skin:   Patient denies skin rash/ lesion and itching.  Eyes:   Patient denies blurred vision and double vision.  Ears/ Nose/ Throat:   Patient denies sore throat and sinus problems.  Hematologic/Lymphatic:   Patient denies easy bruising and swollen glands.  Cardiovascular:   Patient denies leg swelling and chest pains.  Respiratory:   Patient denies cough and shortness of breath.  Endocrine:   Patient denies excessive thirst.  Musculoskeletal:   Patient denies back pain and joint pain.  Neurological:   Patient denies headaches and dizziness.  Psychologic:   Patient denies depression and anxiety.   VITAL SIGNS:     Height 64 in / 162.56 cm  Temperature 98.4 F / 36.8 C   MULTI-SYSTEM PHYSICAL EXAMINATION:    Constitutional: Well-nourished. No physical deformities. Normally developed. Good grooming.  Respiratory: No labored breathing, no use of accessory muscles. Clear bilaterally.  Cardiovascular: Normal temperature, normal extremity pulses, no swelling, no varicosities. Regular rate and rhythm.      ASSESSMENT:      ICD-10 Details  1 GU:   History of bladder cancer - Z85.51   2   Ureteral obstruction - N13.1    PLAN:       1. Metastatic urothelial carcinoma: She will continue observation and follow up with Dr. Alen Blew for further imaging in 3 months.   2. Bilateral ureteral obstruction: She will undergo cystoscopy and bilateral ureteral stent change in late September after she returns from an upcoming cruise for her husband's 80th birthday.. We have reviewed this  procedure in detail including the potential risks, complications, and expected recovery process. Her urine will be cultured today. Her renal function remains stable.   3. Possible fungal infection: She will be prescribed clotrimazole cream to use over the next 2 weeks.   4. Dysuria: This has been slightly more prominent recently. Her urine is going to be cultured today and she will be treated if her symptoms persist/worsen. She also will be prescribed a refill for Uribel.

## 2021-01-08 ENCOUNTER — Ambulatory Visit (HOSPITAL_COMMUNITY)
Admission: RE | Admit: 2021-01-08 | Discharge: 2021-01-08 | Disposition: A | Discharge status: Home / Self Care | Payer: Medicare Other | Attending: Urology | Admitting: Urology

## 2021-01-08 ENCOUNTER — Ambulatory Visit (HOSPITAL_COMMUNITY): Payer: Medicare Other

## 2021-01-08 ENCOUNTER — Encounter (HOSPITAL_COMMUNITY): Payer: Self-pay | Admitting: Urology

## 2021-01-08 ENCOUNTER — Other Ambulatory Visit: Payer: Medicare Other

## 2021-01-08 ENCOUNTER — Ambulatory Visit: Payer: Medicare Other | Admitting: Oncology

## 2021-01-08 ENCOUNTER — Ambulatory Visit (HOSPITAL_COMMUNITY): Payer: Medicare Other | Admitting: Physician Assistant

## 2021-01-08 ENCOUNTER — Ambulatory Visit (HOSPITAL_COMMUNITY): Payer: Medicare Other | Admitting: Anesthesiology

## 2021-01-08 ENCOUNTER — Encounter (HOSPITAL_COMMUNITY)
Admission: RE | Disposition: A | Discharge status: Home / Self Care | Payer: Self-pay | Source: Home / Self Care | Attending: Urology

## 2021-01-08 DIAGNOSIS — N135 Crossing vessel and stricture of ureter without hydronephrosis: Secondary | ICD-10-CM | POA: Insufficient documentation

## 2021-01-08 DIAGNOSIS — Z91018 Allergy to other foods: Secondary | ICD-10-CM | POA: Diagnosis not present

## 2021-01-08 DIAGNOSIS — C799 Secondary malignant neoplasm of unspecified site: Secondary | ICD-10-CM | POA: Insufficient documentation

## 2021-01-08 DIAGNOSIS — R300 Dysuria: Secondary | ICD-10-CM | POA: Diagnosis not present

## 2021-01-08 DIAGNOSIS — Z86718 Personal history of other venous thrombosis and embolism: Secondary | ICD-10-CM | POA: Diagnosis not present

## 2021-01-08 DIAGNOSIS — Z888 Allergy status to other drugs, medicaments and biological substances status: Secondary | ICD-10-CM | POA: Insufficient documentation

## 2021-01-08 DIAGNOSIS — C679 Malignant neoplasm of bladder, unspecified: Secondary | ICD-10-CM | POA: Diagnosis not present

## 2021-01-08 DIAGNOSIS — N131 Hydronephrosis with ureteral stricture, not elsewhere classified: Secondary | ICD-10-CM | POA: Diagnosis not present

## 2021-01-08 DIAGNOSIS — Z91040 Latex allergy status: Secondary | ICD-10-CM | POA: Diagnosis not present

## 2021-01-08 DIAGNOSIS — C678 Malignant neoplasm of overlapping sites of bladder: Secondary | ICD-10-CM | POA: Insufficient documentation

## 2021-01-08 DIAGNOSIS — K219 Gastro-esophageal reflux disease without esophagitis: Secondary | ICD-10-CM | POA: Diagnosis not present

## 2021-01-08 DIAGNOSIS — Z8551 Personal history of malignant neoplasm of bladder: Secondary | ICD-10-CM | POA: Diagnosis not present

## 2021-01-08 HISTORY — PX: CYSTOSCOPY WITH STENT PLACEMENT: SHX5790

## 2021-01-08 SURGERY — CYSTOSCOPY, WITH STENT INSERTION
Anesthesia: General | Site: Ureter | Laterality: Bilateral

## 2021-01-08 MED ORDER — ORAL CARE MOUTH RINSE
15.0000 mL | Freq: Once | OROMUCOSAL | Status: AC
  Administered 2021-01-08: 15 mL via OROMUCOSAL

## 2021-01-08 MED ORDER — DEXAMETHASONE SODIUM PHOSPHATE 4 MG/ML IJ SOLN
INTRAMUSCULAR | Status: DC | PRN
  Administered 2021-01-08: 8 mg via INTRAVENOUS

## 2021-01-08 MED ORDER — SODIUM CHLORIDE 0.9 % IR SOLN
Status: DC | PRN
  Administered 2021-01-08: 3000 mL

## 2021-01-08 MED ORDER — FENTANYL CITRATE (PF) 100 MCG/2ML IJ SOLN
INTRAMUSCULAR | Status: DC | PRN
  Administered 2021-01-08 (×2): 50 ug via INTRAVENOUS

## 2021-01-08 MED ORDER — FENTANYL CITRATE PF 50 MCG/ML IJ SOSY: 25.0000 ug | PREFILLED_SYRINGE | INTRAMUSCULAR | Status: DC | PRN

## 2021-01-08 MED ORDER — CHLORHEXIDINE GLUCONATE 0.12 % MT SOLN: 15.0000 mL | Freq: Once | OROMUCOSAL | Status: DC

## 2021-01-08 MED ORDER — LIDOCAINE HCL 1 % IJ SOLN
INTRAMUSCULAR | Status: DC | PRN
  Administered 2021-01-08: 60 mg via INTRADERMAL

## 2021-01-08 MED ORDER — FENTANYL CITRATE (PF) 100 MCG/2ML IJ SOLN
INTRAMUSCULAR | Status: AC
  Filled 2021-01-08: qty 2

## 2021-01-08 MED ORDER — OXYCODONE HCL 5 MG/5ML PO SOLN
5.0000 mg | Freq: Once | ORAL | Status: DC | PRN
Start: 2021-01-08 — End: 2021-01-08

## 2021-01-08 MED ORDER — DEXAMETHASONE SODIUM PHOSPHATE 10 MG/ML IJ SOLN
INTRAMUSCULAR | Status: AC
  Filled 2021-01-08: qty 1

## 2021-01-08 MED ORDER — OXYCODONE HCL 5 MG PO TABS: 5.0000 mg | ORAL_TABLET | Freq: Once | ORAL | Status: DC | PRN

## 2021-01-08 MED ORDER — MIDAZOLAM HCL 5 MG/5ML IJ SOLN
INTRAMUSCULAR | Status: DC | PRN
  Administered 2021-01-08 (×2): 1 mg via INTRAVENOUS

## 2021-01-08 MED ORDER — URIBEL 118 MG PO CAPS: 1.0000 | ORAL_CAPSULE | Freq: Three times a day (TID) | ORAL | 5 refills | Status: DC | PRN

## 2021-01-08 MED ORDER — MIDAZOLAM HCL 2 MG/2ML IJ SOLN
INTRAMUSCULAR | Status: AC
  Filled 2021-01-08: qty 2

## 2021-01-08 MED ORDER — EPHEDRINE SULFATE 50 MG/ML IJ SOLN
INTRAMUSCULAR | Status: DC | PRN
  Administered 2021-01-08: 7 mg via INTRAVENOUS

## 2021-01-08 MED ORDER — ONDANSETRON HCL 4 MG/2ML IJ SOLN
INTRAMUSCULAR | Status: DC | PRN
  Administered 2021-01-08: 4 mg via INTRAVENOUS

## 2021-01-08 MED ORDER — LACTATED RINGERS IV SOLN: INTRAVENOUS | Status: DC

## 2021-01-08 MED ORDER — LIDOCAINE HCL (PF) 2 % IJ SOLN
INTRAMUSCULAR | Status: AC
  Filled 2021-01-08: qty 5

## 2021-01-08 MED ORDER — ONDANSETRON HCL 4 MG/2ML IJ SOLN: 4.0000 mg | Freq: Once | INTRAMUSCULAR | Status: DC | PRN

## 2021-01-08 MED ORDER — ONDANSETRON HCL 4 MG/2ML IJ SOLN
INTRAMUSCULAR | Status: AC
  Filled 2021-01-08: qty 2

## 2021-01-08 MED ORDER — PHENYLEPHRINE HCL (PRESSORS) 10 MG/ML IV SOLN
INTRAVENOUS | Status: DC | PRN
  Administered 2021-01-08: 80 ug via INTRAVENOUS

## 2021-01-08 MED ORDER — SODIUM CHLORIDE 0.9 % IV SOLN
2.0000 g | Freq: Once | INTRAVENOUS | Status: AC
  Administered 2021-01-08: 2 g via INTRAVENOUS
  Filled 2021-01-08: qty 20

## 2021-01-08 MED ORDER — PROPOFOL 10 MG/ML IV BOLUS
INTRAVENOUS | Status: DC | PRN
  Administered 2021-01-08: 100 mg via INTRAVENOUS

## 2021-01-08 MED ORDER — PROPOFOL 10 MG/ML IV BOLUS
INTRAVENOUS | Status: AC
  Filled 2021-01-08: qty 20

## 2021-01-08 SURGICAL SUPPLY — 13 items
BAG URO CATCHER STRL LF (MISCELLANEOUS) ×2 IMPLANT
CATH INTERMIT  6FR 70CM (CATHETERS) IMPLANT
CLOTH BEACON ORANGE TIMEOUT ST (SAFETY) ×2 IMPLANT
GOWN STRL REUS W/TWL LRG LVL3 (GOWN DISPOSABLE) ×4 IMPLANT
GUIDEWIRE HYBRID NIT STR (WIRE) ×2 IMPLANT
GUIDEWIRE STR DUAL SENSOR (WIRE) ×2 IMPLANT
GUIDEWIRE ZIPWRE .038 STRAIGHT (WIRE) IMPLANT
KIT TURNOVER KIT A (KITS) ×2 IMPLANT
MANIFOLD NEPTUNE II (INSTRUMENTS) ×2 IMPLANT
PACK CYSTO (CUSTOM PROCEDURE TRAY) ×2 IMPLANT
STENT POLARIS LOOP 8FR X 24 CM (STENTS) ×4 IMPLANT
TUBING CONNECTING 10 (TUBING) ×2 IMPLANT
TUBING UROLOGY SET (TUBING) IMPLANT

## 2021-01-08 NOTE — Anesthesia Procedure Notes (Signed)
Procedure Name: LMA Insertion Date/Time: 01/08/2021 10:19 AM Performed by: Garrel Ridgel, CRNA Pre-anesthesia Checklist: Patient identified, Emergency Drugs available, Suction available and Patient being monitored Patient Re-evaluated:Patient Re-evaluated prior to induction Oxygen Delivery Method: Circle system utilized Preoxygenation: Pre-oxygenation with 100% oxygen Induction Type: IV induction Ventilation: Mask ventilation without difficulty LMA: LMA inserted LMA Size: 4.0 Number of attempts: 1 Placement Confirmation: positive ETCO2 Tube secured with: Tape Dental Injury: Teeth and Oropharynx as per pre-operative assessment

## 2021-01-08 NOTE — Anesthesia Postprocedure Evaluation (Signed)
Anesthesia Post Note  Patient: Alexandra Little  Procedure(s) Performed: CYSTOSCOPY WITH BILATERAL STENT CHANGE (Bilateral: Ureter)     Patient location during evaluation: PACU Anesthesia Type: General Level of consciousness: awake and alert and oriented Pain management: pain level controlled Vital Signs Assessment: post-procedure vital signs reviewed and stable Respiratory status: spontaneous breathing, nonlabored ventilation and respiratory function stable Cardiovascular status: blood pressure returned to baseline and stable Postop Assessment: no apparent nausea or vomiting Anesthetic complications: no   No notable events documented.  Last Vitals:  Vitals:   01/08/21 1100 01/08/21 1115  BP: 114/61 108/61  Pulse: 68 67  Resp: 18 12  Temp:    SpO2: 100% 97%    Last Pain:  Vitals:   01/08/21 1120  TempSrc:   PainSc: 0-No pain                 Lola Czerwonka A.

## 2021-01-08 NOTE — Op Note (Signed)
Preoperative diagnosis:  Bilateral ureteral obstruction  Metastatic urothelial carcinoma   Postoperative diagnosis:  Bilateral ureteral obstruction  Metastatic urothelial carcinoma   Procedure:   Cystoscopy Bilateral ureteral stent placement (8 x 24 polaris - no string)   Surgeon: Roxy Horseman, Brooke Bonito. M.D.   Anesthesia: General   Complications: None   Intraoperative findings: Her indwelling stents were mildly encrusted.   EBL: Minimal   Specimens: None   Indication: Alexandra Little is a 75 y.o. patient with bilateral ureteral obstruction. After reviewing the management options for treatment, he elected to proceed with the above surgical procedure(s). We have discussed the potential benefits and risks of the procedure, side effects of the proposed treatment, the likelihood of the patient achieving the goals of the procedure, and any potential problems that might occur during the procedure or recuperation. Informed consent has been obtained.   Description of procedure:   The patient was taken to the operating room and general anesthesia was induced.  The patient was placed in the dorsal lithotomy position, prepped and draped in the usual sterile fashion, and preoperative antibiotics were administered. A preoperative time-out was performed.    Cystourethroscopy was performed.  The patient's urethra was examined and was unremarkable. The bladder was then systematically examined in its entirety. There was no evidence for any bladder tumors, stones, or other mucosal pathology.     Attention then turned to the right ureteral orifice and the patient's indwelling ureteral stent was identified and brought out to the urethral meatus with the flexible graspers.   A 0.38 sensor guidewire was then advanced up the right ureter into the renal pelvis under fluoroscopic guidance.  The wire was then backloaded through the cystoscope and a ureteral stent was advance over the wire using Seldinger  technique.  The stent was positioned appropriately under fluoroscopic and cystoscopic guidance.  The wire was then removed with an adequate stent curl noted in the renal pelvis as well as in the bladder.   Attention then turned to the left ureteral orifice and the patient's indwelling ureteral stent was identified and brought out to the urethral meatus with the flexible graspers.   A 0.38 sensor guidewire was then advanced up the left ureter into the renal pelvis under fluoroscopic guidance.  The wire was then backloaded through the cystoscope and a ureteral stent was advance over the wire using Seldinger technique.  The stent was positioned appropriately under fluoroscopic and cystoscopic guidance.  The wire was then removed with an adequate stent curl noted in the renal pelvis as well as in the bladder.   The bladder was then emptied and the procedure ended.  The patient appeared to tolerate the procedure well and without complications.  The patient was able to be awakened and transferred to the recovery unit in satisfactory condition.      Pryor Curia MD

## 2021-01-08 NOTE — Anesthesia Preprocedure Evaluation (Addendum)
Anesthesia Evaluation  Patient identified by MRN, date of birth, ID band Patient awake    Reviewed: Allergy & Precautions, NPO status , Patient's Chart, lab work & pertinent test results, reviewed documented beta blocker date and time   History of Anesthesia Complications (+) history of anesthetic complications  Airway Mallampati: III  TM Distance: >3 FB Neck ROM: Full    Dental  (+) Chipped, Dental Advisory Given   Pulmonary asthma ,    Pulmonary exam normal breath sounds clear to auscultation       Cardiovascular + DVT  Normal cardiovascular exam Rhythm:Regular Rate:Normal  Hx/o DVT in the past   Neuro/Psych negative neurological ROS  negative psych ROS   GI/Hepatic Neg liver ROS, GERD  Medicated and Controlled,  Endo/Other  Obesity  Renal/GU Renal InsufficiencyRenal disease   Bilateral ureteral obstruction Bladder Ca ( urothelial ) Stage IV, undergoing ChemoRx    Musculoskeletal negative musculoskeletal ROS (+)   Abdominal (+) + obese,   Peds  Hematology  (+) anemia , Eliquis therapy- last dose yesterday pm   Anesthesia Other Findings   Reproductive/Obstetrics                            Anesthesia Physical Anesthesia Plan  ASA: 3  Anesthesia Plan: General   Post-op Pain Management:    Induction: Intravenous  PONV Risk Score and Plan: 4 or greater and Treatment may vary due to age or medical condition and Ondansetron  Airway Management Planned: LMA  Additional Equipment:   Intra-op Plan:   Post-operative Plan: Extubation in OR  Informed Consent: I have reviewed the patients History and Physical, chart, labs and discussed the procedure including the risks, benefits and alternatives for the proposed anesthesia with the patient or authorized representative who has indicated his/her understanding and acceptance.     Dental advisory given  Plan Discussed with:  Anesthesiologist and CRNA  Anesthesia Plan Comments:         Anesthesia Quick Evaluation

## 2021-01-08 NOTE — Transfer of Care (Signed)
Immediate Anesthesia Transfer of Care Note  Patient: Alexandra Little  Procedure(s) Performed: CYSTOSCOPY WITH BILATERAL STENT CHANGE (Bilateral: Ureter)  Patient Location: PACU  Anesthesia Type:General  Level of Consciousness: awake, alert , oriented and patient cooperative  Airway & Oxygen Therapy: Patient Spontanous Breathing and Patient connected to face mask oxygen  Post-op Assessment: Report given to RN and Post -op Vital signs reviewed and stable  Post vital signs: Reviewed and stable  Last Vitals:  Vitals Value Taken Time  BP 126/50 01/08/21 1052  Temp    Pulse 70 01/08/21 1053  Resp 17 01/08/21 1053  SpO2 100 % 01/08/21 1053  Vitals shown include unvalidated device data.  Last Pain:  Vitals:   01/08/21 0801  TempSrc:   PainSc: 0-No pain         Complications: No notable events documented.

## 2021-01-08 NOTE — Discharge Instructions (Signed)

## 2021-01-09 ENCOUNTER — Encounter (HOSPITAL_COMMUNITY): Payer: Self-pay | Admitting: Urology

## 2021-01-17 ENCOUNTER — Other Ambulatory Visit: Payer: Self-pay | Admitting: Oncology

## 2021-01-19 ENCOUNTER — Encounter: Payer: Self-pay | Admitting: Oncology

## 2021-02-03 ENCOUNTER — Other Ambulatory Visit: Payer: Self-pay | Admitting: Oncology

## 2021-02-03 DIAGNOSIS — C679 Malignant neoplasm of bladder, unspecified: Secondary | ICD-10-CM

## 2021-02-04 ENCOUNTER — Encounter: Payer: Self-pay | Admitting: Oncology

## 2021-02-09 ENCOUNTER — Telehealth: Payer: Self-pay | Admitting: Oncology

## 2021-02-09 NOTE — Telephone Encounter (Signed)
Sch per 10/31 inbasket, pt aware

## 2021-02-17 ENCOUNTER — Other Ambulatory Visit: Payer: Medicare Other

## 2021-02-17 DIAGNOSIS — C678 Malignant neoplasm of overlapping sites of bladder: Secondary | ICD-10-CM | POA: Diagnosis not present

## 2021-02-17 DIAGNOSIS — N390 Urinary tract infection, site not specified: Secondary | ICD-10-CM | POA: Diagnosis not present

## 2021-02-17 DIAGNOSIS — N131 Hydronephrosis with ureteral stricture, not elsewhere classified: Secondary | ICD-10-CM | POA: Diagnosis not present

## 2021-02-19 ENCOUNTER — Ambulatory Visit (HOSPITAL_COMMUNITY)
Admission: RE | Admit: 2021-02-19 | Discharge: 2021-02-19 | Disposition: A | Discharge status: Home / Self Care | Payer: Medicare Other | Source: Ambulatory Visit | Attending: Oncology | Admitting: Oncology

## 2021-02-19 ENCOUNTER — Inpatient Hospital Stay: Payer: Medicare Other | Attending: Oncology

## 2021-02-19 ENCOUNTER — Other Ambulatory Visit: Payer: Self-pay

## 2021-02-19 DIAGNOSIS — K449 Diaphragmatic hernia without obstruction or gangrene: Secondary | ICD-10-CM | POA: Insufficient documentation

## 2021-02-19 DIAGNOSIS — J9811 Atelectasis: Secondary | ICD-10-CM | POA: Diagnosis not present

## 2021-02-19 DIAGNOSIS — Z86718 Personal history of other venous thrombosis and embolism: Secondary | ICD-10-CM | POA: Insufficient documentation

## 2021-02-19 DIAGNOSIS — C679 Malignant neoplasm of bladder, unspecified: Secondary | ICD-10-CM | POA: Diagnosis not present

## 2021-02-19 DIAGNOSIS — I700 Atherosclerosis of aorta: Secondary | ICD-10-CM | POA: Diagnosis not present

## 2021-02-19 DIAGNOSIS — N1330 Unspecified hydronephrosis: Secondary | ICD-10-CM | POA: Diagnosis not present

## 2021-02-19 DIAGNOSIS — N134 Hydroureter: Secondary | ICD-10-CM | POA: Diagnosis not present

## 2021-02-19 DIAGNOSIS — Z95828 Presence of other vascular implants and grafts: Secondary | ICD-10-CM

## 2021-02-19 DIAGNOSIS — T451X5S Adverse effect of antineoplastic and immunosuppressive drugs, sequela: Secondary | ICD-10-CM | POA: Insufficient documentation

## 2021-02-19 DIAGNOSIS — Z7901 Long term (current) use of anticoagulants: Secondary | ICD-10-CM | POA: Insufficient documentation

## 2021-02-19 DIAGNOSIS — G620 Drug-induced polyneuropathy: Secondary | ICD-10-CM | POA: Insufficient documentation

## 2021-02-19 LAB — CBC WITH DIFFERENTIAL (CANCER CENTER ONLY)
Abs Immature Granulocytes: 0.03 10*3/uL (ref 0.00–0.07)
Basophils Absolute: 0.1 10*3/uL (ref 0.0–0.1)
Basophils Relative: 1 %
Eosinophils Absolute: 0.3 10*3/uL (ref 0.0–0.5)
Eosinophils Relative: 4 %
HCT: 38.2 % (ref 36.0–46.0)
Hemoglobin: 12.4 g/dL (ref 12.0–15.0)
Immature Granulocytes: 1 %
Lymphocytes Relative: 21 %
Lymphs Abs: 1.3 10*3/uL (ref 0.7–4.0)
MCH: 29.7 pg (ref 26.0–34.0)
MCHC: 32.5 g/dL (ref 30.0–36.0)
MCV: 91.6 fL (ref 80.0–100.0)
Monocytes Absolute: 0.5 10*3/uL (ref 0.1–1.0)
Monocytes Relative: 7 %
Neutro Abs: 4.3 10*3/uL (ref 1.7–7.7)
Neutrophils Relative %: 66 %
Platelet Count: 219 10*3/uL (ref 150–400)
RBC: 4.17 MIL/uL (ref 3.87–5.11)
RDW: 12.5 % (ref 11.5–15.5)
WBC Count: 6.5 10*3/uL (ref 4.0–10.5)
nRBC: 0 % (ref 0.0–0.2)

## 2021-02-19 LAB — CMP (CANCER CENTER ONLY)
ALT: 6 U/L (ref 0–44)
AST: 15 U/L (ref 15–41)
Albumin: 3.7 g/dL (ref 3.5–5.0)
Alkaline Phosphatase: 122 U/L (ref 38–126)
Anion gap: 9 (ref 5–15)
BUN: 31 mg/dL — ABNORMAL HIGH (ref 8–23)
CO2: 20 mmol/L — ABNORMAL LOW (ref 22–32)
Calcium: 9.1 mg/dL (ref 8.9–10.3)
Chloride: 111 mmol/L (ref 98–111)
Creatinine: 1.85 mg/dL — ABNORMAL HIGH (ref 0.44–1.00)
GFR, Estimated: 28 mL/min — ABNORMAL LOW (ref >60)
Glucose, Bld: 87 mg/dL (ref 70–99)
Potassium: 4.7 mmol/L (ref 3.5–5.1)
Sodium: 140 mmol/L (ref 135–145)
Total Bilirubin: 0.6 mg/dL (ref 0.3–1.2)
Total Protein: 7.4 g/dL (ref 6.5–8.1)

## 2021-02-19 MED ORDER — SODIUM CHLORIDE 0.9% FLUSH
10.0000 mL | Freq: Once | INTRAVENOUS | Status: AC
  Administered 2021-02-19: 10 mL

## 2021-02-19 MED ORDER — HEPARIN SOD (PORK) LOCK FLUSH 100 UNIT/ML IV SOLN
500.0000 [IU] | Freq: Once | INTRAVENOUS | Status: AC
  Administered 2021-02-19: 500 [IU]

## 2021-02-24 ENCOUNTER — Ambulatory Visit: Payer: Medicare Other | Admitting: Oncology

## 2021-02-24 ENCOUNTER — Other Ambulatory Visit: Payer: Self-pay

## 2021-02-24 ENCOUNTER — Inpatient Hospital Stay: Payer: Medicare Other | Admitting: Oncology

## 2021-02-24 VITALS — BP 136/74 | HR 91 | Temp 96.8°F | Resp 17 | Ht 64.0 in | Wt 178.0 lb

## 2021-02-24 DIAGNOSIS — N1330 Unspecified hydronephrosis: Secondary | ICD-10-CM | POA: Diagnosis not present

## 2021-02-24 DIAGNOSIS — Z86718 Personal history of other venous thrombosis and embolism: Secondary | ICD-10-CM | POA: Diagnosis not present

## 2021-02-24 DIAGNOSIS — K449 Diaphragmatic hernia without obstruction or gangrene: Secondary | ICD-10-CM | POA: Diagnosis not present

## 2021-02-24 DIAGNOSIS — C679 Malignant neoplasm of bladder, unspecified: Secondary | ICD-10-CM | POA: Diagnosis not present

## 2021-02-24 DIAGNOSIS — Z7901 Long term (current) use of anticoagulants: Secondary | ICD-10-CM | POA: Diagnosis not present

## 2021-02-24 DIAGNOSIS — G620 Drug-induced polyneuropathy: Secondary | ICD-10-CM | POA: Diagnosis not present

## 2021-02-24 DIAGNOSIS — T451X5S Adverse effect of antineoplastic and immunosuppressive drugs, sequela: Secondary | ICD-10-CM | POA: Diagnosis not present

## 2021-02-24 DIAGNOSIS — Z95828 Presence of other vascular implants and grafts: Secondary | ICD-10-CM | POA: Diagnosis not present

## 2021-02-24 DIAGNOSIS — I700 Atherosclerosis of aorta: Secondary | ICD-10-CM | POA: Diagnosis not present

## 2021-02-24 NOTE — Progress Notes (Signed)
Hematology and Oncology Follow Up Visit  Alexandra Little [id number] [birth year] 75 y.o. 02/24/2021 11:05 AM [name], MDMiller, [name], MD   Principle Diagnosis: 75 year old woman with bladder cancer diagnosed in March 2021.  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  Prior Therapy:  She was treated with carboplatin and gemcitabine with the last infusion in March 2021.  She had severe cytopenias after 5 cycles of therapy and was switched to Avelumab in April 2021.    In July 2021 showed worsening of disease occluding retroperitoneal and inguinal adenopathy.  He subsequently was switched to [facility] in September 2021 therapy was poorly tolerated at day 1, 8 and 15 at 28-day cycle.  He subsequently was switched to 1 mg/kg day 1 day 15 out of 28-day cycle.    Last CT scan in February 2022 showed near complete response.   Padcev 1 mg/kg day 1 and day 15 of each cycle.  She received day 1 on July 17, 2020 and therapy has been on hold due to neuropathy and complete response.   Current therapy: Active surveillance.  Interim History: Alexandra Little is here for a follow-up visit.  Since the last visit, she reports no major changes in her health.  She denies any recent hospitalizations or illnesses.  She denies any worsening neuropathy.  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  She denies any painful neuropathy however.  She reports her neuropathy in upper extremities has resolved.  She remains active and continues to attempt activities of daily living.      Medications: Updated on review. Current Outpatient Medications  Medication Sig Dispense Refill   acetaminophen (TYLENOL) 500 MG tablet Take 1,000 mg by mouth every 6 (six) hours as needed for mild pain or headache.     albuterol (VENTOLIN HFA) 108 (90 Base) MCG/ACT inhaler Inhale 2 puffs into the lungs every 4 (four) hours as needed  for shortness of breath.     diphenhydrAMINE (BENADRYL) 25 mg capsule Take 25 mg by mouth every 6 (six) hours as needed for itching.     ELIQUIS 5 MG TABS tablet TAKE 1 TABLET(5 MG) BY MOUTH TWICE DAILY 60 tablet 3   famotidine-calcium carbonate-magnesium hydroxide (PEPCID COMPLETE) 10-800-165 MG chewable tablet Chew 1 tablet by mouth 2 (two) times daily as needed (heartburn/indigestion.).     hydrOXYzine (ATARAX/VISTARIL) 10 MG tablet TAKE 1 TABLET BY MOUTH EVERY 6 HOURS AS NEEDED 60 tablet 3   Meth-Hyo-M Bl-Na Phos-Ph Sal (URIBEL) 118 MG CAPS Take 1 capsule (118 mg total) by mouth every 8 (eight) hours as needed. 30 capsule 5   MYRBETRIQ 50 MG TB24 tablet Take 50 mg by mouth daily.     No current facility-administered medications for this visit.     Allergies:  Allergies  Allergen Reactions   Lisinopril Anaphylaxis   Chlorhexidine Itching   Iodine Swelling    Patient states she either breaks out in hives or her tongue swells   Latex Itching   Salmon Oil [Nutritional Supplements] Swelling      Physical Exam:          ECOG: 1    General appearance: Alert, awake without any distress. Head: Atraumatic without abnormalities Oropharynx: Without any thrush or ulcers. Eyes: No scleral icterus. Lymph nodes: No lymphadenopathy noted in the cervical, supraclavicular, or axillary nodes Heart:regular rate and rhythm, without any murmurs or gallops.   Lung: Clear to auscultation  without any rhonchi, wheezes or dullness to percussion. Abdomin: Soft, nontender without any shifting dullness or ascites. Musculoskeletal: No clubbing or cyanosis. Neurological: No motor or sensory deficits. Skin: No rashes or lesions.          Lab Results: Lab Results  Component Value Date   WBC 6.5 02/19/2021   HGB 12.4 02/19/2021   HCT 38.2 02/19/2021   MCV 91.6 02/19/2021   PLT 219 02/19/2021     Chemistry      Component Value Date/Time   NA 140 02/19/2021 1001   K 4.7  02/19/2021 1001   CL 111 02/19/2021 1001   CO2 20 (L) 02/19/2021 1001   BUN 31 (H) 02/19/2021 1001   CREATININE 1.85 (H) 02/19/2021 1001      Component Value Date/Time   CALCIUM 9.1 02/19/2021 1001   ALKPHOS 122 02/19/2021 1001   AST 15 02/19/2021 1001   ALT 6 02/19/2021 1001   BILITOT 0.6 02/19/2021 1001     IMPRESSION: Nephroureteral stents remain in place with persistent but stable hydronephrosis and ureteral distension bilaterally.   No new or progressive findings or signs of metastatic disease.   Possible early liver disease/cirrhosis, correlate with hepatic function.   Small hiatal hernia.   Aortic Atherosclerosis (ICD10-I70.0).    Impression and Plan:   75 year old woman with:   1.   Bladder cancer diagnosed in 2021.  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  The natural course of her disease was reviewed at this time and treatment choices were discussed.  CT scan on February 19, 2021 was personally reviewed and showed no evidence of relapsed disease.  Treatment options including restarting Padcev versus oral targeted therapy versus continued observation were reviewed.  Given her residual neuropathy we have opted to continue active surveillance at this time and update her staging scan and 3 to 4 months.   2.  IV access: Port-A-Cath continues to be in use at this time.  This is becoming a slightly bothersome for her.  I gave her the option of removing it and possibly replacing it on the other side.  He is considering this option at this time.     3.  Hydronephrosis: Kidney function remained stable with bilateral stents in place.  4.  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   5.  Neuropathy:  Related to Padcev which has been discontinued at this time.  Neuropathy is stable and slightly improving slowly.   6.  Follow-up: In the next 6 to 7 weeks for Port-A-Cath flush and  MD follow-up.   30  minutes were spent on this visit.  The time was dedicated to reviewing laboratory data, disease status update, reviewing imaging studies and salvage treatment choices in the future.   Zola Button, MD 11/15/202211:05 AM

## 2021-03-04 ENCOUNTER — Encounter: Payer: Self-pay | Admitting: Oncology

## 2021-03-04 ENCOUNTER — Other Ambulatory Visit: Payer: Self-pay

## 2021-03-04 DIAGNOSIS — C679 Malignant neoplasm of bladder, unspecified: Secondary | ICD-10-CM

## 2021-03-04 MED ORDER — APIXABAN 5 MG PO TABS: ORAL_TABLET | ORAL | 3 refills | Status: DC

## 2021-03-11 ENCOUNTER — Other Ambulatory Visit: Payer: Self-pay | Admitting: Urology

## 2021-03-25 DIAGNOSIS — Z1159 Encounter for screening for other viral diseases: Secondary | ICD-10-CM | POA: Diagnosis not present

## 2021-03-27 DIAGNOSIS — Z1159 Encounter for screening for other viral diseases: Secondary | ICD-10-CM | POA: Diagnosis not present

## 2021-03-30 DIAGNOSIS — N390 Urinary tract infection, site not specified: Secondary | ICD-10-CM | POA: Diagnosis not present

## 2021-04-01 DIAGNOSIS — Z1159 Encounter for screening for other viral diseases: Secondary | ICD-10-CM | POA: Diagnosis not present

## 2021-04-01 NOTE — Patient Instructions (Addendum)
DUE TO COVID-19 ONLY ONE VISITOR IS ALLOWED TO COME WITH YOU AND STAY IN THE WAITING ROOM ONLY DURING PRE OP AND PROCEDURE.   **NO VISITORS ARE ALLOWED IN THE SHORT STAY AREA OR RECOVERY ROOM!!**       Your procedure is scheduled on: 04/09/21   Report to [facility] Main Entrance    Report to admitting at 7:00 AM   Call this number if you have problems the morning of surgery [phone]   Follow clear liquid diet the day before surgery.   May have liquids until 6:15 AM day of surgery  CLEAR LIQUID DIET  Foods Allowed                                                                     Foods Excluded  Water, Black Coffee and tea (no milk or creamer)           liquids that you cannot  Plain Jell-O in any flavor  (No red)                                    see through such as: Fruit ices (not with fruit pulp)                                            milk, soups, orange juice              Iced Popsicles (No red)                                                All solid food                                   Apple juices Sports drinks like Gatorade (No red) Lightly seasoned clear broth or consume(fat free) Sugar  Sample Menu Breakfast                                Lunch                                     Supper Cranberry juice                    Beef broth                            Chicken broth Jell-O                                     Grape juice  Apple juice Coffee or tea                        Jell-O                                      Popsicle                                                Coffee or tea                        Coffee or tea    Oral Hygiene is also important to reduce your risk of infection.                                    Remember - BRUSH YOUR TEETH THE MORNING OF SURGERY WITH YOUR REGULAR TOOTHPASTE   Take these medicines the morning of surgery with A SIP OF WATER: Tylenol, Inhalers                              You  may not have any metal on your body including hair pins, jewelry, and body piercing             Do not wear make-up, lotions, powders, perfumes, or deodorant  Do not wear nail polish including gel and S&S, artificial/acrylic nails, or any other type of covering on natural nails including finger and toenails. If you have artificial nails, gel coating, etc. that needs to be removed by a nail salon please have this removed prior to surgery or surgery may need to be canceled/ delayed if the surgeon/ anesthesia feels like they are unable to be safely monitored.   Do not shave  48 hours prior to surgery.    Do not bring valuables to the hospital. [place].    Patients discharged on the day of surgery will not be allowed to drive home.              Please read over the following fact sheets you were given: IF YOU HAVE QUESTIONS ABOUT YOUR PRE-OP INSTRUCTIONS PLEASE CALL [facility] - Preparing for Surgery Before surgery, you can play an important role.  Because skin is not sterile, your skin needs to be as free of germs as possible.  You can reduce the number of germs on your skin by washing with CHG (chlorahexidine gluconate) soap before surgery.  CHG is an antiseptic cleaner which kills germs and bonds with the skin to continue killing germs even after washing. Please DO NOT use if you have an allergy to CHG or antibacterial soaps.  If your skin becomes reddened/irritated stop using the CHG and inform your nurse when you arrive at Short Stay. Do not shave (including legs and underarms) for at least 48 hours prior to the first CHG shower.  You may shave your face/neck.  Please follow these instructions carefully:  1.  Shower with CHG Soap the night before  surgery and the  morning of surgery.  2.  If you choose to wash your hair, wash your hair first as usual with your normal  shampoo.  3.  After you shampoo, rinse your hair and  body thoroughly to remove the shampoo.                             4.  Use CHG as you would any other liquid soap.  You can apply chg directly to the skin and wash.  Gently with a scrungie or clean washcloth.  5.  Apply the CHG Soap to your body ONLY FROM THE NECK DOWN.   Do   not use on face/ open                           Wound or open sores. Avoid contact with eyes, ears mouth and   genitals (private parts).                       Wash face,  Genitals (private parts) with your normal soap.             6.  Wash thoroughly, paying special attention to the area where your    surgery  will be performed.  7.  Thoroughly rinse your body with warm water from the neck down.  8.  DO NOT shower/wash with your normal soap after using and rinsing off the CHG Soap.                9.  Pat yourself dry with a clean towel.            10.  Wear clean pajamas.            11.  Place clean sheets on your bed the night of your first shower and do not  sleep with pets. Day of Surgery : Do not apply any lotions/deodorants the morning of surgery.  Please wear clean clothes to the hospital/surgery center.  FAILURE TO FOLLOW THESE INSTRUCTIONS MAY RESULT IN THE CANCELLATION OF YOUR SURGERY  PATIENT SIGNATURE_________________________________  NURSE SIGNATURE__________________________________  ________________________________________________________________________

## 2021-04-01 NOTE — Progress Notes (Addendum)
COVID swab appointment: n/a  COVID Vaccine Completed: yes x5 Date COVID Vaccine completed: 06/11/19, 07/12/19 Has received booster: 12/12/19 COVID vaccine manufacturer: [employer]   Date of COVID positive in last 90 days: no  PCP - [name], MD Cardiologist - n/a  Chest x-ray - CT 02/19/21 Epic EKG - 06/20/20 Epic Stress Test - yes years ago per pt ECHO - yes years ago per pt Cardiac Cath - n/a Pacemaker/ICD device last checked: n/a Spinal Cord Stimulator: n/a  Sleep Study - n/a CPAP -   Fasting Blood Sugar - n/a Checks Blood Sugar _____ times a day  Blood Thinner Instructions: Eliquis, remain on per Dr. Alinda Money Aspirin Instructions: Last Dose:  Activity level: Can go up a flight of stairs and perform activities of daily living without stopping and without symptoms of chest pain or shortness of breath.       Anesthesia review: anemia, blood clots, asthma, port, creatinine 2.49  Patient denies shortness of breath, fever, cough and chest pain at PAT appointment  Patient is allergic to chlorhexidine. She will use dial soap instead  Patient verbalized understanding of instructions that were given to them at the PAT appointment. Patient was also instructed that they will need to review over the PAT instructions again at home before surgery.

## 2021-04-02 ENCOUNTER — Encounter (HOSPITAL_COMMUNITY): Payer: Self-pay

## 2021-04-02 ENCOUNTER — Other Ambulatory Visit: Payer: Self-pay

## 2021-04-02 ENCOUNTER — Encounter (HOSPITAL_COMMUNITY)
Admission: RE | Admit: 2021-04-02 | Discharge: 2021-04-02 | Disposition: A | Discharge status: Home / Self Care | Payer: Medicare Other | Source: Ambulatory Visit | Attending: Urology | Admitting: Urology

## 2021-04-02 VITALS — BP 151/76 | HR 79 | Temp 98.4°F | Resp 16 | Ht 64.0 in | Wt 178.4 lb

## 2021-04-02 DIAGNOSIS — Z7901 Long term (current) use of anticoagulants: Secondary | ICD-10-CM | POA: Insufficient documentation

## 2021-04-02 DIAGNOSIS — Z01812 Encounter for preprocedural laboratory examination: Secondary | ICD-10-CM | POA: Diagnosis not present

## 2021-04-02 DIAGNOSIS — D649 Anemia, unspecified: Secondary | ICD-10-CM | POA: Diagnosis not present

## 2021-04-02 HISTORY — DX: Pneumonia, unspecified organism: J18.9

## 2021-04-02 HISTORY — DX: Anemia, unspecified: D64.9

## 2021-04-02 LAB — CBC
HCT: 37.6 % (ref 36.0–46.0)
Hemoglobin: 11.6 g/dL — ABNORMAL LOW (ref 12.0–15.0)
MCH: 29.7 pg (ref 26.0–34.0)
MCHC: 30.9 g/dL (ref 30.0–36.0)
MCV: 96.2 fL (ref 80.0–100.0)
Platelets: 199 10*3/uL (ref 150–400)
RBC: 3.91 MIL/uL (ref 3.87–5.11)
RDW: 13.1 % (ref 11.5–15.5)
WBC: 6.3 10*3/uL (ref 4.0–10.5)
nRBC: 0 % (ref 0.0–0.2)

## 2021-04-02 LAB — BASIC METABOLIC PANEL
Anion gap: 9 (ref 5–15)
BUN: 37 mg/dL — ABNORMAL HIGH (ref 8–23)
CO2: 18 mmol/L — ABNORMAL LOW (ref 22–32)
Calcium: 9.1 mg/dL (ref 8.9–10.3)
Chloride: 111 mmol/L (ref 98–111)
Creatinine, Ser: 2.49 mg/dL — ABNORMAL HIGH (ref 0.44–1.00)
GFR, Estimated: 20 mL/min — ABNORMAL LOW (ref >60)
Glucose, Bld: 90 mg/dL (ref 70–99)
Potassium: 4.9 mmol/L (ref 3.5–5.1)
Sodium: 138 mmol/L (ref 135–145)

## 2021-04-02 NOTE — Progress Notes (Signed)
Creatinine 2.49. Results routed to Dr. Alinda Money

## 2021-04-06 DIAGNOSIS — Z1159 Encounter for screening for other viral diseases: Secondary | ICD-10-CM | POA: Diagnosis not present

## 2021-04-08 NOTE — H&P (Signed)
1. Metastatic urothelial carcinoma the bladder  2. Bilateral ureteral obstruction   Alexandra Little follows up today for ongoing management of her bilateral ureteral obstruction secondary to metastatic bladder cancer. She is scheduled for repeat imaging and follow-up with Dr. Alen Blew over the next couple of weeks. She states that her current Polaris 8 x 24 stents have been a significant improvement for her compared to her prior double-J ureteral stents. She denies any hematuria. She is getting up a little bit more at night approximately 2-3 times and does have some pain on termination of urination. However, overall her urinary incontinence and urinary symptoms are an improvement. She denies significant pelvic pain on a regular basis.     ALLERGIES: Latex Lisinopril Nutritional Supplement Salmon    MEDICATIONS: Myrbetriq 50 mg tablet, extended release 24 hr 1 tablet PO Daily  Clotrimazole-Betamethasone 1 %-0.05 % cream Apply to affected area bid for 2 weeks. 45 g tube  Eliquis 5 mg tablet  Nystatin 100,000 unit/gram powder Apply to affected area tid for up to 2 weeks  Uribel 118 mg-10 mg-40.8 mg-36 mg-0.12 mg capsule 1 capsule PO Q 8 H As Directed     GU PSH: Cystoscopy Insert Stent - 01/08/2021, Bilateral - 09/22/2020, Bilateral - 06/20/2020     NON-GU PSH: No Non-GU PSH    GU PMH: History of bladder cancer - 12/02/2020 Ureteral obstruction - 12/02/2020, - 08/13/2020, - 06/18/2020 Bladder Cancer overlapping sites - 08/13/2020, - 06/18/2020 Kidney Failure Unspec      PMH Notes:   1) Metastatic urothelial cancer of the bladder: Alexandra Little relocated from [place] to [place] in March 2022 to be closer to her daughter, Alexandra Little. She was treated with systemic therapy at [facility]. Due to her chronic kidney disease, she was not a candidate for cisplatin chemotherapy and was treated with carboplatin and gemcitabine. She did not tolerate this well and has subsequently been on multiple immunotherapy  ease. When she presented to me, she was on a alternate dose of Padcev and has been doing fairly well with this. She has had a complete response based on imaging and continues on this therapy and has established care with Dr. Alen Blew.   2) Bilateral ureteral obstructionL In October of 2020 when she was diagnosed, she was also noted to have ureteral obstruction and underwent bilateral nephrostomy tube placement. She recently had internalization of bilateral ureteral stents and removal of her nephrostomy tubes. She seems to have tolerated this quite well. Her serum creatinine a few weeks ago was 1.4 and this was felt to be very consistent with her baseline possibly better than her baseline which has hovered around 1.6. She has been tolerating her stents relatively well. Her Cr was elevated over 3 when she presented to me but declined after I changed and upsized her stents in March 2022.   Mar 2022: Bilateral ureteral stent change (upsized to 8 x 24)  Jun 2022: Bilateral ureteral stent change (8 x 24 ) - mild encrustation  Sep 2022: Bilateral ureteral stent change (8x24) - mild encrustation   3) Incontinence: She does have chronic urinary incontinence and uses approximately 4 pads per day. However, this is not significantly worse since her ureteral stents were placed. She describes her incontinence as unconscious. It is unclear whether she may have some stress related incontinence and urge related incontinence. She is unable to provide an accurate history in this regard. She does have some significant vaginal in skin irritation related to her pads and  incontinence.   Current treatment: Myrbetriq 50 mg   NON-GU PMH: Asthma DVT, History    FAMILY HISTORY: 1 Daughter - Runs in Family   SOCIAL HISTORY: Marital Status: Unknown Preferred Language: English; Ethnicity: Not Hispanic Or Latino; Race: White Current Smoking Status: Patient has never smoked.   Tobacco Use Assessment Completed: Used Tobacco in  last 30 days? Does drink.  Drinks 2 caffeinated drinks per day.    REVIEW OF SYSTEMS:    GU Review Female:   Patient denies frequent urination, hard to postpone urination, burning /pain with urination, get up at night to urinate, leakage of urine, stream starts and stops, trouble starting your stream, have to strain to urinate, and currently pregnant.  Gastrointestinal (Lower):   Patient denies diarrhea and constipation.  Gastrointestinal (Upper):   Patient denies nausea and vomiting.  Constitutional:   Patient denies fever, night sweats, weight loss, and fatigue.  Skin:   Patient denies skin rash/ lesion and itching.  Eyes:   Patient denies blurred vision and double vision.  Ears/ Nose/ Throat:   Patient denies sore throat and sinus problems.  Hematologic/Lymphatic:   Patient denies swollen glands and easy bruising.  Cardiovascular:   Patient denies leg swelling and chest pains.  Respiratory:   Patient denies cough and shortness of breath.  Endocrine:   Patient denies excessive thirst.  Musculoskeletal:   Patient denies back pain and joint pain.  Neurological:   Patient denies headaches and dizziness.  Psychologic:   Patient denies depression and anxiety.   VITAL SIGNS:     Weight 170 lb / 77.11 kg  Height 64 in / 162.56 cm  BMI 29.2 kg/m   MULTI-SYSTEM PHYSICAL EXAMINATION:    Constitutional: Well-nourished. No physical deformities. Normally developed. Good grooming.  Respiratory: No labored breathing, no use of accessory muscles. Clear bilaterally.  Cardiovascular: Normal temperature, normal extremity pulses, no swelling, no varicosities. Regular rate and rhythm.       ASSESSMENT:      ICD-10 Details  1 GU:   Ureteral obstruction - N13.1   2   Bladder Cancer overlapping sites - C67.8    PLAN:     1. Bilateral ureteral obstruction: Her urine will be cultured today and she will be treated if this is positive considering some of her pain symptoms. We will then need to get a  repeat UA and culture prior to her next stent change. In the interim, we have agreed to plan to perform cystoscopy and bilateral ureteral stent change in January. We will plan to continue with 8 x 24 Polaris stents as she has had improved symptoms with the stents compared to double-J stents.

## 2021-04-09 ENCOUNTER — Ambulatory Visit (HOSPITAL_COMMUNITY): Payer: Medicare Other | Admitting: Physician Assistant

## 2021-04-09 ENCOUNTER — Ambulatory Visit (HOSPITAL_COMMUNITY): Payer: Medicare Other

## 2021-04-09 ENCOUNTER — Encounter (HOSPITAL_COMMUNITY)
Admission: RE | Disposition: A | Discharge status: Home / Self Care | Payer: Self-pay | Source: Home / Self Care | Attending: Urology

## 2021-04-09 ENCOUNTER — Ambulatory Visit (HOSPITAL_COMMUNITY): Payer: Medicare Other | Admitting: Certified Registered Nurse Anesthetist

## 2021-04-09 ENCOUNTER — Ambulatory Visit (HOSPITAL_COMMUNITY)
Admission: RE | Admit: 2021-04-09 | Discharge: 2021-04-09 | Disposition: A | Discharge status: Home / Self Care | Payer: Medicare Other | Attending: Urology | Admitting: Urology

## 2021-04-09 ENCOUNTER — Encounter (HOSPITAL_COMMUNITY): Payer: Self-pay | Admitting: Urology

## 2021-04-09 DIAGNOSIS — R32 Unspecified urinary incontinence: Secondary | ICD-10-CM | POA: Insufficient documentation

## 2021-04-09 DIAGNOSIS — Z7901 Long term (current) use of anticoagulants: Secondary | ICD-10-CM | POA: Diagnosis not present

## 2021-04-09 DIAGNOSIS — D6481 Anemia due to antineoplastic chemotherapy: Secondary | ICD-10-CM | POA: Diagnosis not present

## 2021-04-09 DIAGNOSIS — Z466 Encounter for fitting and adjustment of urinary device: Secondary | ICD-10-CM | POA: Diagnosis not present

## 2021-04-09 DIAGNOSIS — C678 Malignant neoplasm of overlapping sites of bladder: Secondary | ICD-10-CM | POA: Diagnosis not present

## 2021-04-09 DIAGNOSIS — Z01812 Encounter for preprocedural laboratory examination: Secondary | ICD-10-CM | POA: Insufficient documentation

## 2021-04-09 DIAGNOSIS — K219 Gastro-esophageal reflux disease without esophagitis: Secondary | ICD-10-CM | POA: Insufficient documentation

## 2021-04-09 DIAGNOSIS — D649 Anemia, unspecified: Secondary | ICD-10-CM | POA: Diagnosis not present

## 2021-04-09 DIAGNOSIS — N135 Crossing vessel and stricture of ureter without hydronephrosis: Secondary | ICD-10-CM | POA: Diagnosis not present

## 2021-04-09 DIAGNOSIS — C679 Malignant neoplasm of bladder, unspecified: Secondary | ICD-10-CM | POA: Diagnosis not present

## 2021-04-09 DIAGNOSIS — J45909 Unspecified asthma, uncomplicated: Secondary | ICD-10-CM | POA: Diagnosis not present

## 2021-04-09 HISTORY — PX: CYSTOSCOPY WITH STENT PLACEMENT: SHX5790

## 2021-04-09 LAB — APTT: aPTT: 36 seconds (ref 24–36)

## 2021-04-09 LAB — PROTIME-INR
INR: 1.5 — ABNORMAL HIGH (ref 0.8–1.2)
Prothrombin Time: 18 seconds — ABNORMAL HIGH (ref 11.4–15.2)

## 2021-04-09 SURGERY — CYSTOSCOPY, WITH STENT INSERTION
Anesthesia: General | Laterality: Bilateral

## 2021-04-09 MED ORDER — DEXAMETHASONE SODIUM PHOSPHATE 10 MG/ML IJ SOLN
INTRAMUSCULAR | Status: DC | PRN
Start: 2021-04-09 — End: 2021-04-09
  Administered 2021-04-09: 10 mg via INTRAVENOUS

## 2021-04-09 MED ORDER — LIDOCAINE 2% (20 MG/ML) 5 ML SYRINGE
INTRAMUSCULAR | Status: DC | PRN
  Administered 2021-04-09: 80 mg via INTRAVENOUS

## 2021-04-09 MED ORDER — ONDANSETRON HCL 4 MG/2ML IJ SOLN
INTRAMUSCULAR | Status: AC
  Filled 2021-04-09: qty 2

## 2021-04-09 MED ORDER — PROPOFOL 10 MG/ML IV BOLUS
INTRAVENOUS | Status: DC | PRN
  Administered 2021-04-09: 50 mg via INTRAVENOUS
  Administered 2021-04-09: 150 mg via INTRAVENOUS

## 2021-04-09 MED ORDER — ONDANSETRON HCL 4 MG/2ML IJ SOLN: 4.0000 mg | Freq: Once | INTRAMUSCULAR | Status: DC | PRN

## 2021-04-09 MED ORDER — CHLORHEXIDINE GLUCONATE 0.12 % MT SOLN: 15.0000 mL | Freq: Once | OROMUCOSAL | Status: AC

## 2021-04-09 MED ORDER — SUCCINYLCHOLINE CHLORIDE 200 MG/10ML IV SOSY
PREFILLED_SYRINGE | INTRAVENOUS | Status: AC
  Filled 2021-04-09: qty 10

## 2021-04-09 MED ORDER — ACETAMINOPHEN 10 MG/ML IV SOLN
INTRAVENOUS | Status: AC
  Filled 2021-04-09: qty 100

## 2021-04-09 MED ORDER — ONDANSETRON HCL 4 MG/2ML IJ SOLN
INTRAMUSCULAR | Status: DC | PRN
  Administered 2021-04-09: 4 mg via INTRAVENOUS

## 2021-04-09 MED ORDER — DIPHENHYDRAMINE HCL 50 MG/ML IJ SOLN
6.2500 mg | Freq: Once | INTRAMUSCULAR | Status: AC
  Administered 2021-04-09: 09:00:00 6.25 mg via INTRAVENOUS

## 2021-04-09 MED ORDER — FENTANYL CITRATE (PF) 100 MCG/2ML IJ SOLN
INTRAMUSCULAR | Status: DC | PRN
  Administered 2021-04-09: 25 ug via INTRAVENOUS
  Administered 2021-04-09: 50 ug via INTRAVENOUS
  Administered 2021-04-09: 25 ug via INTRAVENOUS

## 2021-04-09 MED ORDER — ACETAMINOPHEN 10 MG/ML IV SOLN
1000.0000 mg | Freq: Once | INTRAVENOUS | Status: DC | PRN
  Administered 2021-04-09: 09:00:00 1000 mg via INTRAVENOUS

## 2021-04-09 MED ORDER — OXYCODONE HCL 5 MG/5ML PO SOLN: 5.0000 mg | Freq: Once | ORAL | Status: DC | PRN

## 2021-04-09 MED ORDER — OXYCODONE HCL 5 MG PO TABS: 5.0000 mg | ORAL_TABLET | Freq: Once | ORAL | Status: DC | PRN

## 2021-04-09 MED ORDER — PROPOFOL 10 MG/ML IV BOLUS
INTRAVENOUS | Status: AC
  Filled 2021-04-09: qty 20

## 2021-04-09 MED ORDER — SUCCINYLCHOLINE CHLORIDE 200 MG/10ML IV SOSY
PREFILLED_SYRINGE | INTRAVENOUS | Status: DC | PRN
  Administered 2021-04-09: 100 mg via INTRAVENOUS

## 2021-04-09 MED ORDER — DEXAMETHASONE SODIUM PHOSPHATE 10 MG/ML IJ SOLN
INTRAMUSCULAR | Status: AC
  Filled 2021-04-09: qty 1

## 2021-04-09 MED ORDER — DIPHENHYDRAMINE HCL 50 MG/ML IJ SOLN
INTRAMUSCULAR | Status: AC
  Filled 2021-04-09: qty 1

## 2021-04-09 MED ORDER — ORAL CARE MOUTH RINSE
15.0000 mL | Freq: Once | OROMUCOSAL | Status: AC
  Administered 2021-04-09: 08:00:00 15 mL via OROMUCOSAL

## 2021-04-09 MED ORDER — LACTATED RINGERS IV SOLN: INTRAVENOUS | Status: DC

## 2021-04-09 MED ORDER — AMOXICILLIN 500 MG PO TABS: 500.0000 mg | ORAL_TABLET | Freq: Two times a day (BID) | ORAL | 0 refills | Status: DC

## 2021-04-09 MED ORDER — FENTANYL CITRATE PF 50 MCG/ML IJ SOSY: 25.0000 ug | PREFILLED_SYRINGE | INTRAMUSCULAR | Status: DC | PRN

## 2021-04-09 MED ORDER — FENTANYL CITRATE (PF) 100 MCG/2ML IJ SOLN
INTRAMUSCULAR | Status: AC
  Filled 2021-04-09: qty 2

## 2021-04-09 MED ORDER — CIPROFLOXACIN IN D5W 400 MG/200ML IV SOLN
400.0000 mg | Freq: Once | INTRAVENOUS | Status: AC
  Administered 2021-04-09: 09:00:00 400 mg via INTRAVENOUS
  Filled 2021-04-09: qty 200

## 2021-04-09 SURGICAL SUPPLY — 14 items
BAG URO CATCHER STRL LF (MISCELLANEOUS) ×3 IMPLANT
CATH URETL OPEN END 6FR 70 (CATHETERS) IMPLANT
CLOTH BEACON ORANGE TIMEOUT ST (SAFETY) ×3 IMPLANT
GLOVE SURG ENC TEXT LTX SZ7.5 (GLOVE) ×3 IMPLANT
GOWN STRL REUS W/TWL LRG LVL3 (GOWN DISPOSABLE) ×6 IMPLANT
GUIDEWIRE STR DUAL SENSOR (WIRE) ×5 IMPLANT
GUIDEWIRE ZIPWRE .038 STRAIGHT (WIRE) IMPLANT
KIT TURNOVER KIT A (KITS) IMPLANT
MANIFOLD NEPTUNE II (INSTRUMENTS) ×3 IMPLANT
PACK CYSTO (CUSTOM PROCEDURE TRAY) ×3 IMPLANT
STENT POLARIS LOOP 8FR X 24 CM (STENTS) ×4 IMPLANT
TUBING CONNECTING 10 (TUBING) ×2 IMPLANT
TUBING CONNECTING 10' (TUBING) ×1
TUBING UROLOGY SET (TUBING) IMPLANT

## 2021-04-09 NOTE — Anesthesia Postprocedure Evaluation (Signed)
Anesthesia Post Note  Patient: Alexandra Little  Procedure(s) Performed: CYSTOSCOPY WITH STENT  CHANGE (Bilateral)     Patient location during evaluation: PACU Anesthesia Type: General Level of consciousness: awake and alert Pain management: pain level controlled Vital Signs Assessment: post-procedure vital signs reviewed and stable Respiratory status: spontaneous breathing, nonlabored ventilation, respiratory function stable and patient connected to nasal cannula oxygen Cardiovascular status: blood pressure returned to baseline and stable Postop Assessment: no apparent nausea or vomiting Anesthetic complications: no   No notable events documented.  Last Vitals:  Vitals:   04/09/21 0922 04/09/21 0930  BP:  128/62  Pulse: 69 72  Resp: 13 11  Temp:  36.6 C  SpO2: 100% 100%    Last Pain:  Vitals:   04/09/21 0930  PainSc: 3                  [name]

## 2021-04-09 NOTE — Anesthesia Preprocedure Evaluation (Signed)
Anesthesia Evaluation  Patient identified by MRN, date of birth, ID band Patient awake    Reviewed: Allergy & Precautions, NPO status , Patient's Chart, lab work & pertinent test results  Airway Mallampati: II  TM Distance: >3 FB Neck ROM: Full    Dental no notable dental hx.    Pulmonary asthma ,    Pulmonary exam normal breath sounds clear to auscultation       Cardiovascular negative cardio ROS Normal cardiovascular exam Rhythm:Regular Rate:Normal     Neuro/Psych negative neurological ROS  negative psych ROS   GI/Hepatic Neg liver ROS, GERD  ,  Endo/Other  negative endocrine ROS  Renal/GU negative Renal ROS  negative genitourinary   Musculoskeletal negative musculoskeletal ROS (+)   Abdominal   Peds negative pediatric ROS (+)  Hematology  (+) anemia ,   Anesthesia Other Findings   Reproductive/Obstetrics negative OB ROS                             Anesthesia Physical Anesthesia Plan  ASA: 3  Anesthesia Plan: General   Post-op Pain Management: Minimal or no pain anticipated   Induction: Intravenous  PONV Risk Score and Plan: 3 and Ondansetron, Dexamethasone and Treatment may vary due to age or medical condition  Airway Management Planned: LMA  Additional Equipment:   Intra-op Plan:   Post-operative Plan: Extubation in OR  Informed Consent: I have reviewed the patients History and Physical, chart, labs and discussed the procedure including the risks, benefits and alternatives for the proposed anesthesia with the patient or authorized representative who has indicated his/her understanding and acceptance.     Dental advisory given  Plan Discussed with: CRNA and Surgeon  Anesthesia Plan Comments:         Anesthesia Quick Evaluation

## 2021-04-09 NOTE — Transfer of Care (Signed)
Immediate Anesthesia Transfer of Care Note  Patient: Alexandra Little  Procedure(s) Performed: CYSTOSCOPY WITH STENT  CHANGE (Bilateral)  Patient Location: PACU  Anesthesia Type:General  Level of Consciousness: awake, alert  and oriented  Airway & Oxygen Therapy: Patient Spontanous Breathing and Patient connected to face mask oxygen  Post-op Assessment: Report given to RN and Post -op Vital signs reviewed and stable  Post vital signs: Reviewed and stable  Last Vitals:  Vitals Value Taken Time  BP 124/55 04/09/21 0906  Temp 36.5 C 04/09/21 0906  Pulse 83 04/09/21 0907  Resp 17 04/09/21 0907  SpO2 100 % 04/09/21 0907  Vitals shown include unvalidated device data.  Last Pain:  Vitals:   04/09/21 0741  PainSc: 0-No pain         Complications: No notable events documented.

## 2021-04-09 NOTE — Op Note (Signed)
Preoperative diagnosis:  Bilateral ureteral obstruction Metastatic bladder cancer   Postoperative diagnosis:  Bilateral ureteral obstruction Metastatic bladder cancer   Procedure:  Cystoscopy Bilateral ureteral stent placement (8 x 24 Polaris - no string)  Surgeon: Roxy Horseman, Brooke Bonito. M.D.  Anesthesia: General  Complications: None  Intraoperative findings: Her right stent was moderately encrusted.  The left stent was very mildly encrusted.  EBL: Minimal  Specimens: None  Indication: Alexandra Little is a 75 y.o. patient with bilateral ureteral obstruction. After reviewing the management options for treatment, he elected to proceed with the above surgical procedure(s). We have discussed the potential benefits and risks of the procedure, side effects of the proposed treatment, the likelihood of the patient achieving the goals of the procedure, and any potential problems that might occur during the procedure or recuperation. Informed consent has been obtained.  Description of procedure:  The patient was taken to the operating room and general anesthesia was induced.  The patient was placed in the dorsal lithotomy position, prepped and draped in the usual sterile fashion, and preoperative antibiotics were administered. A preoperative time-out was performed.   Cystourethroscopy was performed.  The patients urethra was examined and was unremarkable. The bladder was then systematically examined in its entirety. There was no evidence for any bladder tumors, stones, or other mucosal pathology.    Attention then turned to the right ureteral orifice and the patients indwelling ureteral stent was identified and brought out to the urethral meatus with the flexible graspers.  A 0.38 sensor guidewire was then advanced up the right ureter into the renal pelvis under fluoroscopic guidance.  The wire was then backloaded through the cystoscope and a ureteral stent was advance over the wire using  Seldinger technique.  The stent was positioned appropriately under fluoroscopic and cystoscopic guidance.  The wire was then removed with an adequate stent curl noted in the renal pelvis as well as in the bladder.  Attention then turned to the left ureteral orifice and the patients indwelling ureteral stent was identified and brought out to the urethral meatus with the flexible graspers.  A 0.38 sensor guidewire was then advanced up the left ureter into the renal pelvis under fluoroscopic guidance.  The wire was then backloaded through the cystoscope and a ureteral stent was advance over the wire using Seldinger technique.  The stent was positioned appropriately under fluoroscopic and cystoscopic guidance.  The wire was then removed with an adequate stent curl noted in the renal pelvis as well as in the bladder.  The bladder was then emptied and the procedure ended.  The patient appeared to tolerate the procedure well and without complications.  The patient was able to be awakened and transferred to the recovery unit in satisfactory condition.    Pryor Curia MD

## 2021-04-09 NOTE — Discharge Instructions (Addendum)

## 2021-04-09 NOTE — Anesthesia Procedure Notes (Signed)
Procedure Name: LMA Insertion Date/Time: 04/09/2021 8:39 AM Performed by: Bently Wyss D, CRNA Pre-anesthesia Checklist: Patient identified, Emergency Drugs available, Suction available and Patient being monitored Patient Re-evaluated:Patient Re-evaluated prior to induction Oxygen Delivery Method: Circle system utilized Preoxygenation: Pre-oxygenation with 100% oxygen Induction Type: IV induction Ventilation: Mask ventilation without difficulty LMA: LMA inserted LMA Size: 3.0 Tube type: Oral Number of attempts: 3 Placement Confirmation: positive ETCO2 and breath sounds checked- equal and bilateral Tube secured with: Tape Dental Injury: Teeth and Oropharynx as per pre-operative assessment

## 2021-04-10 ENCOUNTER — Encounter (HOSPITAL_COMMUNITY): Payer: Self-pay | Admitting: Urology

## 2021-04-14 ENCOUNTER — Encounter (HOSPITAL_COMMUNITY): Payer: Self-pay | Admitting: Certified Registered Nurse Anesthetist

## 2021-04-14 ENCOUNTER — Other Ambulatory Visit: Payer: Self-pay | Admitting: Urology

## 2021-04-14 DIAGNOSIS — N131 Hydronephrosis with ureteral stricture, not elsewhere classified: Secondary | ICD-10-CM | POA: Diagnosis not present

## 2021-04-14 NOTE — Progress Notes (Signed)
Sent message, via epic in basket, requesting orders in epic from surgeon.  

## 2021-04-14 NOTE — Anesthesia Preprocedure Evaluation (Deleted)
Anesthesia Evaluation    Airway        Dental   Pulmonary asthma ,           Cardiovascular negative cardio ROS       Neuro/Psych negative neurological ROS  negative psych ROS   GI/Hepatic Neg liver ROS, GERD  ,  Endo/Other  negative endocrine ROS  Renal/GU negative Renal ROS Bladder dysfunction  Bladder Ca    Musculoskeletal negative musculoskeletal ROS (+)   Abdominal   Peds  Hematology  (+) anemia ,   Anesthesia Other Findings   Reproductive/Obstetrics                             Anesthesia Physical Anesthesia Plan  ASA: 3  Anesthesia Plan: General   Post-op Pain Management:    Induction: Intravenous  PONV Risk Score and Plan: 3 and Ondansetron, Dexamethasone and Treatment may vary due to age or medical condition  Airway Management Planned: Mask and LMA  Additional Equipment: None  Intra-op Plan:   Post-operative Plan: Extubation in OR  Informed Consent:   Plan Discussed with:   Anesthesia Plan Comments: (Lab Results      Component                Value               Date                      WBC                      6.3                 04/02/2021                HGB                      11.6 (L)            04/02/2021                HCT                      37.6                04/02/2021                MCV                      96.2                04/02/2021                PLT                      199                 04/02/2021           Lab Results      Component                Value               Date                      NA  138                 04/02/2021                K                        4.9                 04/02/2021                CO2                      18 (L)              04/02/2021                GLUCOSE                  90                  04/02/2021                BUN                      37 (H)              04/02/2021                 CREATININE               2.49 (H)            04/02/2021                CALCIUM                  9.1                 04/02/2021                GFRNONAA                 20 (L)              04/02/2021          )        Anesthesia Quick Evaluation

## 2021-04-15 ENCOUNTER — Ambulatory Visit (HOSPITAL_COMMUNITY): Admission: RE | Admit: 2021-04-15 | Payer: Medicare Other | Source: Home / Self Care | Admitting: Urology

## 2021-04-15 ENCOUNTER — Encounter (HOSPITAL_COMMUNITY): Admission: RE | Payer: Self-pay | Source: Home / Self Care

## 2021-04-15 DIAGNOSIS — Z1159 Encounter for screening for other viral diseases: Secondary | ICD-10-CM | POA: Diagnosis not present

## 2021-04-15 SURGERY — CYSTOSCOPY, WITH RETROGRADE PYELOGRAM AND URETERAL STENT INSERTION
Anesthesia: General | Laterality: Bilateral

## 2021-04-22 DIAGNOSIS — N131 Hydronephrosis with ureteral stricture, not elsewhere classified: Secondary | ICD-10-CM | POA: Diagnosis not present

## 2021-04-27 DIAGNOSIS — Z20822 Contact with and (suspected) exposure to covid-19: Secondary | ICD-10-CM | POA: Diagnosis not present

## 2021-05-04 DIAGNOSIS — Z20822 Contact with and (suspected) exposure to covid-19: Secondary | ICD-10-CM | POA: Diagnosis not present

## 2021-05-11 DIAGNOSIS — Z20822 Contact with and (suspected) exposure to covid-19: Secondary | ICD-10-CM | POA: Diagnosis not present

## 2021-05-12 ENCOUNTER — Inpatient Hospital Stay: Payer: Medicare Other | Attending: Oncology

## 2021-05-12 ENCOUNTER — Inpatient Hospital Stay: Payer: Medicare Other | Admitting: Oncology

## 2021-05-12 ENCOUNTER — Other Ambulatory Visit: Payer: Self-pay

## 2021-05-12 VITALS — BP 134/61 | HR 86 | Temp 97.7°F | Resp 16 | Ht 64.0 in | Wt 182.2 lb

## 2021-05-12 DIAGNOSIS — Z7901 Long term (current) use of anticoagulants: Secondary | ICD-10-CM | POA: Diagnosis not present

## 2021-05-12 DIAGNOSIS — T451X5A Adverse effect of antineoplastic and immunosuppressive drugs, initial encounter: Secondary | ICD-10-CM | POA: Diagnosis not present

## 2021-05-12 DIAGNOSIS — Z86718 Personal history of other venous thrombosis and embolism: Secondary | ICD-10-CM | POA: Diagnosis not present

## 2021-05-12 DIAGNOSIS — R319 Hematuria, unspecified: Secondary | ICD-10-CM | POA: Insufficient documentation

## 2021-05-12 DIAGNOSIS — N1330 Unspecified hydronephrosis: Secondary | ICD-10-CM | POA: Diagnosis not present

## 2021-05-12 DIAGNOSIS — C679 Malignant neoplasm of bladder, unspecified: Secondary | ICD-10-CM

## 2021-05-12 DIAGNOSIS — G620 Drug-induced polyneuropathy: Secondary | ICD-10-CM | POA: Diagnosis not present

## 2021-05-12 DIAGNOSIS — Z95828 Presence of other vascular implants and grafts: Secondary | ICD-10-CM

## 2021-05-12 DIAGNOSIS — R3912 Poor urinary stream: Secondary | ICD-10-CM | POA: Insufficient documentation

## 2021-05-12 LAB — CBC WITH DIFFERENTIAL (CANCER CENTER ONLY)
Abs Immature Granulocytes: 0.03 10*3/uL (ref 0.00–0.07)
Basophils Absolute: 0.1 10*3/uL (ref 0.0–0.1)
Basophils Relative: 1 %
Eosinophils Absolute: 0.2 10*3/uL (ref 0.0–0.5)
Eosinophils Relative: 4 %
HCT: 34.5 % — ABNORMAL LOW (ref 36.0–46.0)
Hemoglobin: 11.1 g/dL — ABNORMAL LOW (ref 12.0–15.0)
Immature Granulocytes: 1 %
Lymphocytes Relative: 20 %
Lymphs Abs: 1.3 10*3/uL (ref 0.7–4.0)
MCH: 29.6 pg (ref 26.0–34.0)
MCHC: 32.2 g/dL (ref 30.0–36.0)
MCV: 92 fL (ref 80.0–100.0)
Monocytes Absolute: 0.5 10*3/uL (ref 0.1–1.0)
Monocytes Relative: 7 %
Neutro Abs: 4.5 10*3/uL (ref 1.7–7.7)
Neutrophils Relative %: 67 %
Platelet Count: 240 10*3/uL (ref 150–400)
RBC: 3.75 MIL/uL — ABNORMAL LOW (ref 3.87–5.11)
RDW: 14.3 % (ref 11.5–15.5)
WBC Count: 6.6 10*3/uL (ref 4.0–10.5)
nRBC: 0 % (ref 0.0–0.2)

## 2021-05-12 LAB — CMP (CANCER CENTER ONLY)
ALT: 7 U/L (ref 0–44)
AST: 17 U/L (ref 15–41)
Albumin: 4 g/dL (ref 3.5–5.0)
Alkaline Phosphatase: 102 U/L (ref 38–126)
Anion gap: 8 (ref 5–15)
BUN: 22 mg/dL (ref 8–23)
CO2: 23 mmol/L (ref 22–32)
Calcium: 9.2 mg/dL (ref 8.9–10.3)
Chloride: 107 mmol/L (ref 98–111)
Creatinine: 2 mg/dL — ABNORMAL HIGH (ref 0.44–1.00)
GFR, Estimated: 25 mL/min — ABNORMAL LOW (ref >60)
Glucose, Bld: 124 mg/dL — ABNORMAL HIGH (ref 70–99)
Potassium: 4.4 mmol/L (ref 3.5–5.1)
Sodium: 138 mmol/L (ref 135–145)
Total Bilirubin: 0.5 mg/dL (ref 0.3–1.2)
Total Protein: 7.4 g/dL (ref 6.5–8.1)

## 2021-05-12 MED ORDER — SODIUM CHLORIDE 0.9% FLUSH
10.0000 mL | Freq: Once | INTRAVENOUS | Status: AC
  Administered 2021-05-12: 10 mL

## 2021-05-12 NOTE — Progress Notes (Signed)
Little and Oncology Follow Up Visit  Alexandra Little [id number] Dec 19, 1945 76 y.o. 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  Prior Therapy:  She was treated with carboplatin and gemcitabine with the last infusion in March 2021.  She had severe cytopenias after 5 cycles of therapy and was switched to Avelumab in April 2021.    In July 2021 showed worsening of disease occluding retroperitoneal and inguinal adenopathy.  Alexandra subsequently was switched to [facility] in September 2021 therapy was poorly tolerated at day 1, 8 and 15 at 28-day cycle.  Alexandra subsequently was switched to 1 mg/kg day 1 day 15 out of 28-day cycle.    Last CT scan in February 2022 showed near complete response.   Padcev 1 mg/kg day 1 and day 15 of each cycle.  She received day 1 on July 17, 2020 and therapy has been on hold due to neuropathy and complete response.   Current therapy: Active surveillance.  Interim History: [name] is here for repeat evaluation.  Since her last visit, she reports no major changes in her health.  She has reported issues with hematuria especially on Eliquis with the hematuria improves at a lower doses.  She is currently taking Eliquis 5 mg every other day.  She has denied any hematuria, dysuria or urinary frequency.  She does report a weak stream at times.      Medications: Reviewed without changes. Current Outpatient Medications  Medication Sig Dispense Refill   acetaminophen (TYLENOL) 500 MG tablet Take 1,000 mg by mouth every 6 (six) hours as needed for mild pain or headache.     albuterol (VENTOLIN HFA) 108 (90 Base) MCG/ACT inhaler Inhale 2 puffs into the lungs every 4 (four) hours as needed for shortness of breath.     amoxicillin (AMOXIL) 500 MG tablet Take 1 tablet (500 mg total) by mouth 2 (two)  times daily. 8 tablet 0   apixaban (ELIQUIS) 5 MG TABS tablet TAKE 1 TABLET(5 MG) BY MOUTH TWICE DAILY 60 tablet 3   diphenhydrAMINE (BENADRYL) 25 mg capsule Take 25 mg by mouth every 6 (six) hours as needed for itching.     famotidine-calcium carbonate-magnesium hydroxide (PEPCID COMPLETE) 10-800-165 MG chewable tablet Chew 1 tablet by mouth 2 (two) times daily as needed (heartburn/indigestion.).     hydrOXYzine (ATARAX/VISTARIL) 10 MG tablet TAKE 1 TABLET BY MOUTH EVERY 6 HOURS AS NEEDED 60 tablet 3   Meth-Hyo-M Bl-Na Phos-Ph Sal (URIBEL) 118 MG CAPS Take 1 capsule (118 mg total) by mouth every 8 (eight) hours as needed. 30 capsule 5   MYRBETRIQ 50 MG TB24 tablet Take 50 mg by mouth daily.     No current facility-administered medications for this visit.     Allergies:  Allergies  Allergen Reactions   Lisinopril Anaphylaxis   Chlorhexidine Itching   Iodine Swelling    Patient states she either breaks out in hives or her tongue swells   Latex Itching   Salmon Oil [Nutritional Supplements] Swelling      Physical Exam:     Blood pressure 134/61, pulse 86, temperature 97.7 F (36.5 C), temperature source Axillary, resp. rate 16, height 5\' 4"  (1.626 m), weight 182 lb 3.2 oz (82.6 kg), SpO2 99 %.      ECOG: 1    General appearance: Comfortable appearing without any discomfort Head: Normocephalic  without any trauma Oropharynx: Mucous membranes are moist and pink without any thrush or ulcers. Eyes: Pupils are equal and round reactive to light. Lymph nodes: No cervical, supraclavicular, inguinal or axillary lymphadenopathy.   Heart:regular rate and rhythm.  S1 and S2 without leg edema. Lung: Clear without any rhonchi or wheezes.  No dullness to percussion. Abdomin: Soft, nontender, nondistended with good bowel sounds.  No hepatosplenomegaly. Musculoskeletal: No joint deformity or effusion.  Full range of motion noted. Neurological: No deficits noted on motor, sensory and  deep tendon reflex exam. Skin: No petechial rash or dryness.  Appeared moist.            Lab Results: Lab Results  Component Value Date   WBC 6.3 04/02/2021   HGB 11.6 (L) 04/02/2021   HCT 37.6 04/02/2021   MCV 96.2 04/02/2021   PLT 199 04/02/2021     Chemistry      Component Value Date/Time   NA 138 04/02/2021 1105   K 4.9 04/02/2021 1105   CL 111 04/02/2021 1105   CO2 18 (L) 04/02/2021 1105   BUN 37 (H) 04/02/2021 1105   CREATININE 2.49 (H) 04/02/2021 1105   CREATININE 1.85 (H) 02/19/2021 1001      Component Value Date/Time   CALCIUM 9.1 04/02/2021 1105   ALKPHOS 122 02/19/2021 1001   AST 15 02/19/2021 1001   ALT 6 02/19/2021 1001   BILITOT 0.6 02/19/2021 1001       Impression and Plan:   76 year old woman with:   1.   Stage IV high-grade urothelial carcinoma of the bladder with pelvic adenopathy diagnosed in 2021.  She achieved a complete response to therapy and currently on active surveillance.  Risks and benefits of continuing this approach versus restarting Padcev were discussed.  Complication occluding worsening neuropathy, fatigue and hyperglycemia among others were reiterated.  At this time way I recommended continued active surveillance and we will update her staging scans before the next visit.   2.  IV access: Port-A-Cath remains in place and flushed periodically.     3.  Hydronephrosis: Bilateral stents have been removed and not replaced.  We will update her kidney function today.  She has follow-up with Dr. Alinda Money regarding this issue.  4.  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  We will consider discontinuation in the future.   5.  Neuropathy: Remains stable to slightly improved.  This is related to Padcev.   6.  Follow-up: In 2 months for repeat follow-up and repeat imaging studies.   30  minutes  were dedicated to this encounter.  Time was spent on reviewing laboratory data, disease status update and outlining future plan of care.   Zola Button, MD 1/31/20231:21 PM

## 2021-05-13 ENCOUNTER — Encounter: Payer: Self-pay | Admitting: Oncology

## 2021-05-13 ENCOUNTER — Telehealth: Payer: Self-pay | Admitting: *Deleted

## 2021-05-13 NOTE — Telephone Encounter (Signed)
Called patient in response to MyChart message regarding CT scan. Informed patient that scan expected date was 06/30/21 with labs ordered pre scan for same day. Patient states she will call [facility] and schedule for 06/30/21. She will come to [facility] between now and scan to pick up oral contrast.  She requested to change her appt with Dr. Alen Blew currently scheduled on 3/29 to previous week following CT if possible. She will be on a cruise from 3/26 through 4/2.

## 2021-05-14 ENCOUNTER — Encounter: Payer: Self-pay | Admitting: Oncology

## 2021-05-14 NOTE — Telephone Encounter (Signed)
Contacted patient to inform that her app with Dr. Alen Blew rescheduled for 07/03/21 at [place] at her request. She verbalized understanding and states she will let office know the date/time of her CT once it is scheduled.

## 2021-06-16 DIAGNOSIS — N131 Hydronephrosis with ureteral stricture, not elsewhere classified: Secondary | ICD-10-CM | POA: Diagnosis not present

## 2021-06-16 DIAGNOSIS — N390 Urinary tract infection, site not specified: Secondary | ICD-10-CM | POA: Diagnosis not present

## 2021-06-16 DIAGNOSIS — Z8551 Personal history of malignant neoplasm of bladder: Secondary | ICD-10-CM | POA: Diagnosis not present

## 2021-06-19 ENCOUNTER — Telehealth: Payer: Self-pay | Admitting: Oncology

## 2021-06-19 NOTE — Telephone Encounter (Signed)
.  Called patient to schedule appointment per 3/9 inbasket, patient is aware of date and time.  ? ?

## 2021-06-30 ENCOUNTER — Other Ambulatory Visit: Payer: Medicare Other

## 2021-07-03 ENCOUNTER — Ambulatory Visit: Payer: Medicare Other | Admitting: Oncology

## 2021-07-08 ENCOUNTER — Ambulatory Visit: Payer: Medicare Other | Admitting: Oncology

## 2021-07-21 ENCOUNTER — Other Ambulatory Visit (HOSPITAL_COMMUNITY): Payer: Medicare Other

## 2021-07-21 ENCOUNTER — Encounter (HOSPITAL_COMMUNITY): Payer: Self-pay

## 2021-07-21 ENCOUNTER — Other Ambulatory Visit: Payer: Self-pay

## 2021-07-21 ENCOUNTER — Ambulatory Visit (HOSPITAL_COMMUNITY)
Admission: RE | Admit: 2021-07-21 | Discharge: 2021-07-21 | Disposition: A | Discharge status: Home / Self Care | Payer: Medicare Other | Source: Ambulatory Visit | Attending: Oncology | Admitting: Oncology

## 2021-07-21 ENCOUNTER — Inpatient Hospital Stay: Payer: Medicare Other | Attending: Oncology

## 2021-07-21 DIAGNOSIS — C679 Malignant neoplasm of bladder, unspecified: Secondary | ICD-10-CM | POA: Diagnosis not present

## 2021-07-21 DIAGNOSIS — T451X5A Adverse effect of antineoplastic and immunosuppressive drugs, initial encounter: Secondary | ICD-10-CM | POA: Insufficient documentation

## 2021-07-21 DIAGNOSIS — J984 Other disorders of lung: Secondary | ICD-10-CM | POA: Diagnosis not present

## 2021-07-21 DIAGNOSIS — I749 Embolism and thrombosis of unspecified artery: Secondary | ICD-10-CM | POA: Insufficient documentation

## 2021-07-21 DIAGNOSIS — G620 Drug-induced polyneuropathy: Secondary | ICD-10-CM | POA: Insufficient documentation

## 2021-07-21 DIAGNOSIS — N1330 Unspecified hydronephrosis: Secondary | ICD-10-CM | POA: Diagnosis not present

## 2021-07-21 DIAGNOSIS — Z95828 Presence of other vascular implants and grafts: Secondary | ICD-10-CM

## 2021-07-21 LAB — CBC WITH DIFFERENTIAL (CANCER CENTER ONLY)
Abs Immature Granulocytes: 0.02 10*3/uL (ref 0.00–0.07)
Basophils Absolute: 0 10*3/uL (ref 0.0–0.1)
Basophils Relative: 1 %
Eosinophils Absolute: 0.2 10*3/uL (ref 0.0–0.5)
Eosinophils Relative: 4 %
HCT: 37.2 % (ref 36.0–46.0)
Hemoglobin: 11.9 g/dL — ABNORMAL LOW (ref 12.0–15.0)
Immature Granulocytes: 0 %
Lymphocytes Relative: 19 %
Lymphs Abs: 1 10*3/uL (ref 0.7–4.0)
MCH: 29.3 pg (ref 26.0–34.0)
MCHC: 32 g/dL (ref 30.0–36.0)
MCV: 91.6 fL (ref 80.0–100.0)
Monocytes Absolute: 0.4 10*3/uL (ref 0.1–1.0)
Monocytes Relative: 8 %
Neutro Abs: 3.4 10*3/uL (ref 1.7–7.7)
Neutrophils Relative %: 68 %
Platelet Count: 169 10*3/uL (ref 150–400)
RBC: 4.06 MIL/uL (ref 3.87–5.11)
RDW: 12.9 % (ref 11.5–15.5)
WBC Count: 5.1 10*3/uL (ref 4.0–10.5)
nRBC: 0 % (ref 0.0–0.2)

## 2021-07-21 LAB — CMP (CANCER CENTER ONLY)
ALT: 6 U/L (ref 0–44)
AST: 16 U/L (ref 15–41)
Albumin: 4.1 g/dL (ref 3.5–5.0)
Alkaline Phosphatase: 112 U/L (ref 38–126)
Anion gap: 6 (ref 5–15)
BUN: 31 mg/dL — ABNORMAL HIGH (ref 8–23)
CO2: 24 mmol/L (ref 22–32)
Calcium: 9.3 mg/dL (ref 8.9–10.3)
Chloride: 110 mmol/L (ref 98–111)
Creatinine: 1.82 mg/dL — ABNORMAL HIGH (ref 0.44–1.00)
GFR, Estimated: 28 mL/min — ABNORMAL LOW (ref >60)
Glucose, Bld: 93 mg/dL (ref 70–99)
Potassium: 4.5 mmol/L (ref 3.5–5.1)
Sodium: 140 mmol/L (ref 135–145)
Total Bilirubin: 0.5 mg/dL (ref 0.3–1.2)
Total Protein: 7.4 g/dL (ref 6.5–8.1)

## 2021-07-21 MED ORDER — HEPARIN SOD (PORK) LOCK FLUSH 100 UNIT/ML IV SOLN: 500.0000 [IU] | Freq: Once | INTRAVENOUS | Status: AC

## 2021-07-21 MED ORDER — HEPARIN SOD (PORK) LOCK FLUSH 100 UNIT/ML IV SOLN
INTRAVENOUS | Status: AC
  Administered 2021-07-21: 500 [IU] via INTRAVENOUS
  Filled 2021-07-21: qty 5

## 2021-07-21 MED ORDER — SODIUM CHLORIDE 0.9% FLUSH
10.0000 mL | Freq: Once | INTRAVENOUS | Status: AC
  Administered 2021-07-21: 10 mL

## 2021-07-24 ENCOUNTER — Inpatient Hospital Stay: Payer: Medicare Other | Admitting: Oncology

## 2021-07-24 ENCOUNTER — Other Ambulatory Visit: Payer: Self-pay

## 2021-07-24 VITALS — BP 118/71 | HR 89 | Temp 97.6°F | Resp 17 | Ht 64.0 in | Wt 184.1 lb

## 2021-07-24 DIAGNOSIS — Z95828 Presence of other vascular implants and grafts: Secondary | ICD-10-CM

## 2021-07-24 DIAGNOSIS — C679 Malignant neoplasm of bladder, unspecified: Secondary | ICD-10-CM | POA: Diagnosis not present

## 2021-07-24 DIAGNOSIS — I749 Embolism and thrombosis of unspecified artery: Secondary | ICD-10-CM | POA: Diagnosis not present

## 2021-07-24 DIAGNOSIS — N1330 Unspecified hydronephrosis: Secondary | ICD-10-CM | POA: Diagnosis not present

## 2021-07-24 DIAGNOSIS — G620 Drug-induced polyneuropathy: Secondary | ICD-10-CM | POA: Diagnosis not present

## 2021-07-24 DIAGNOSIS — T451X5A Adverse effect of antineoplastic and immunosuppressive drugs, initial encounter: Secondary | ICD-10-CM | POA: Diagnosis not present

## 2021-07-24 MED ORDER — HYDROXYZINE HCL 10 MG PO TABS: 10.0000 mg | ORAL_TABLET | Freq: Four times a day (QID) | ORAL | 3 refills | Status: DC | PRN

## 2021-07-24 NOTE — Progress Notes (Signed)
Hematology and Oncology Follow Up Visit ? ?Alexandra Little ?[id number] ?January 10, 1946 76 y.o. ?07/24/2021 10:30 AM ?Alexandra Little, MDMiller, [name], MD  ? ?Principle Diagnosis: 76 year old woman with bladder cancer diagnosed in 2021.  He was found to have stage IV high-grade urothelial carcinoma with FGFR positive mutation.  ? ?Prior Therapy: ? ?She was treated with carboplatin and gemcitabine with the last infusion in March 2021.  She had severe cytopenias after 5 cycles of therapy and was switched to Avelumab in April 2021.  ? ? In July 2021 showed worsening of disease occluding retroperitoneal and inguinal adenopathy.  He subsequently was switched to [facility] in September 2021 therapy was poorly tolerated at day 1, 8 and 15 at 28-day cycle.  He subsequently was switched to 1 mg/kg day 1 day 15 out of 28-day cycle.    Last CT scan in February 2022 showed near complete response. ? ? ?Padcev 1 mg/kg day 1 and day 15 of each cycle.  She received day 1 on July 17, 2020 and therapy has been on hold due to neuropathy and complete response. ? ? ?Current therapy: Active surveillance. ? ?Interim History: Alexandra Little returns today for a follow-up visit.  Since last visit, she reports feeling well without any major complaints.  She denies any nausea, vomiting or abdominal pain.  She denies any hospitalizations or illnesses.  She has reported occasional pruritus and she takes Atarax for it.  Neuropathy in lower extremity remains manageable and not progressive. ? ? ? ? ? ?Medications: Updated on review. ?Current Outpatient Medications  ?Medication Sig Dispense Refill  ? acetaminophen (TYLENOL) 500 MG tablet Take 1,000 mg by mouth every 6 (six) hours as needed for mild pain or headache.    ? albuterol (VENTOLIN HFA) 108 (90 Base) MCG/ACT inhaler Inhale 2 puffs into the lungs every 4 (four) hours as needed for shortness of breath.    ? amoxicillin (AMOXIL) 500 MG tablet Take 1 tablet (500 mg total) by mouth 2 (two) times daily. 8 tablet 0  ?  apixaban (ELIQUIS) 5 MG TABS tablet TAKE 1 TABLET(5 MG) BY MOUTH TWICE DAILY 60 tablet 3  ? diphenhydrAMINE (BENADRYL) 25 mg capsule Take 25 mg by mouth every 6 (six) hours as needed for itching.    ? famotidine-calcium carbonate-magnesium hydroxide (PEPCID COMPLETE) 10-800-165 MG chewable tablet Chew 1 tablet by mouth 2 (two) times daily as needed (heartburn/indigestion.).    ? hydrOXYzine (ATARAX/VISTARIL) 10 MG tablet TAKE 1 TABLET BY MOUTH EVERY 6 HOURS AS NEEDED 60 tablet 3  ? Meth-Hyo-M Bl-Na Phos-Ph Sal (URIBEL) 118 MG CAPS Take 1 capsule (118 mg total) by mouth every 8 (eight) hours as needed. 30 capsule 5  ? MYRBETRIQ 50 MG TB24 tablet Take 50 mg by mouth daily.    ? ?No current facility-administered medications for this visit.  ? ? ? ?Allergies:  ?Allergies  ?Allergen Reactions  ? Lisinopril Anaphylaxis  ? Chlorhexidine Itching  ? Iodine Swelling  ?  Patient states she either breaks out in hives or her tongue swells  ? Latex Itching  ? Salmon Oil [Nutritional Supplements] Swelling  ? ? ? ? ?Physical Exam: ? ? ? ? ? ?Blood pressure 118/71, pulse 89, temperature 97.6 ?F (36.4 ?C), temperature source Temporal, resp. rate 17, height '5\' 4"'$  (1.626 m), weight 184 lb 1.6 oz (83.5 kg), SpO2 97 %. ? ? ? ? ? ?ECOG: 1 ? ? ? ?General appearance: Alert, awake without any distress. ?Head: Atraumatic without abnormalities ?Oropharynx: Without any thrush or  ulcers. ?Eyes: No scleral icterus. ?Lymph nodes: No lymphadenopathy noted in the cervical, supraclavicular, or axillary nodes ?Heart:regular rate and rhythm, without any murmurs or gallops.   ?Lung: Clear to auscultation without any rhonchi, wheezes or dullness to percussion. ?Abdomin: Soft, nontender without any shifting dullness or ascites. ?Musculoskeletal: No clubbing or cyanosis. ?Neurological: No motor or sensory deficits. ?Skin: No rashes or lesions. ?Psychiatric: Mood and affect appeared normal. ? ? ? ? ? ? ? ? ? ? ? ?Lab Results: ?Lab Results  ?Component Value  Date  ? WBC 5.1 07/21/2021  ? HGB 11.9 (L) 07/21/2021  ? HCT 37.2 07/21/2021  ? MCV 91.6 07/21/2021  ? PLT 169 07/21/2021  ? ?  Chemistry   ?   ?Component Value Date/Time  ? NA 140 07/21/2021 0859  ? K 4.5 07/21/2021 0859  ? CL 110 07/21/2021 0859  ? CO2 24 07/21/2021 0859  ? BUN 31 (H) 07/21/2021 0859  ? CREATININE 1.82 (H) 07/21/2021 0859  ?    ?Component Value Date/Time  ? CALCIUM 9.3 07/21/2021 0859  ? ALKPHOS 112 07/21/2021 0859  ? AST 16 07/21/2021 0859  ? ALT 6 07/21/2021 0859  ? BILITOT 0.5 07/21/2021 0859  ?  ? ?IMPRESSION: ?1. Interval removal of the bilateral ureteral stents with persistent ?at least moderate hydronephrosis and mild perinephric soft tissue ?stranding bilaterally. The degree of collecting system dilatation is ?similar to the prior study, and no obstructing ureteral calculi are ?seen. Findings could reflect residual/recurrent distal ureteral ?obstruction. ?2. Stable irregular diffuse bladder wall thickening and perivesical ?soft tissue stranding. Up pre vesicle soft tissue nodule is ?unchanged from the baseline study of 08/21/2020. No definite signs ?of metastatic disease. ?3. Increased lobularity along the splenic hilum may reflect ?accessory splenic tissue as there are no other enlarged lymph nodes. ?4. No acute or significant findings in the chest. ? ?Impression and Plan: ? ? ?76 year old woman with: ?  ?1.   Bladder cancer diagnosed in 2021.  She developed stage IV high-grade urothelial carcinoma with pelvic adenopathy. ? ?Her disease status was discussed at this time and treatment choices were reviewed.  CT scan obtained on July 21, 2021 showed no evidence of metastatic disease.  Risks and benefits of continued active surveillance at this time versus restarting anticancer treatment were reviewed.  Based on these findings I recommended continued active surveillance and consideration for salvage therapy if she has metastatic disease.  She is agreeable to continue. ?  ?2.  IV access:  Risks and benefits of Port-A-Cath discussed today and she desires it to be removed.  This can be reintroduced again if needed. ?  ? ? ?3.  Hydronephrosis: Related to malignancy with residual hydronephrosis with kidney function remained stable. ? ?4.  Thromboembolism: She is off Eliquis at this time without any recurrent thrombosis. ? ? ?5.  Neuropathy: Related to previous chemotherapy exposure. ?  ?6.  Follow-up: She will return in 5 months for repeat evaluation and imaging studies. ?  ?30  minutes were spent on this visit.  The time was dedicated to reviewing laboratory data, disease status update and future plan of care discussion. ? ? ?Zola Button, MD ?4/14/202310:30 AM ? ?

## 2021-08-25 ENCOUNTER — Encounter: Payer: Self-pay | Admitting: Oncology

## 2021-08-25 ENCOUNTER — Encounter: Payer: Self-pay | Admitting: *Deleted

## 2021-09-03 ENCOUNTER — Ambulatory Visit (HOSPITAL_COMMUNITY)
Admission: RE | Admit: 2021-09-03 | Discharge: 2021-09-03 | Disposition: A | Discharge status: Home / Self Care | Payer: Medicare Other | Source: Ambulatory Visit | Attending: Oncology | Admitting: Oncology

## 2021-09-03 DIAGNOSIS — C679 Malignant neoplasm of bladder, unspecified: Secondary | ICD-10-CM | POA: Insufficient documentation

## 2021-09-03 DIAGNOSIS — Z95828 Presence of other vascular implants and grafts: Secondary | ICD-10-CM

## 2021-09-03 DIAGNOSIS — Z452 Encounter for adjustment and management of vascular access device: Secondary | ICD-10-CM | POA: Insufficient documentation

## 2021-09-03 HISTORY — PX: IR REMOVAL TUN ACCESS W/ PORT W/O FL MOD SED: IMG2290

## 2021-09-03 MED ORDER — LIDOCAINE HCL 1 % IJ SOLN
INTRAMUSCULAR | Status: AC
  Filled 2021-09-03: qty 20

## 2021-09-03 MED ORDER — LIDOCAINE-EPINEPHRINE (PF) 1 %-1:200000 IJ SOLN
INTRAMUSCULAR | Status: DC | PRN
  Administered 2021-09-03: 8 mL via INTRADERMAL

## 2021-09-03 MED ORDER — LIDOCAINE-EPINEPHRINE 1 %-1:100000 IJ SOLN
INTRAMUSCULAR | Status: AC
  Filled 2021-09-03: qty 1

## 2021-09-03 NOTE — Procedures (Signed)
Vascular and Interventional Radiology Procedure Note  Patient: Alexandra Little DOB: 1945/12/18 Medical Record Number: [id number] Note Date/Time: 09/03/21 10:42 AM   Performing Physician: Michaelle Birks, MD Assistant(s): None  Diagnosis:  Bladder CA  Procedure: PORT REMOVAL  Anesthesia: Local Anesthetic Complications: None Estimated Blood Loss: Minimal Specimens:  None  Findings:  Successful removal of a right-sided venous port. Primary incision closure. Dermabond at skin.  See detailed procedure note with images in PACS. The patient tolerated the procedure well without incident or complication and was returned to Recovery in stable condition.    Michaelle Birks, MD [facility]   Pager. [place]

## 2021-09-11 DIAGNOSIS — H02833 Dermatochalasis of right eye, unspecified eyelid: Secondary | ICD-10-CM | POA: Diagnosis not present

## 2021-09-11 DIAGNOSIS — H524 Presbyopia: Secondary | ICD-10-CM | POA: Diagnosis not present

## 2021-09-11 DIAGNOSIS — H25813 Combined forms of age-related cataract, bilateral: Secondary | ICD-10-CM | POA: Diagnosis not present

## 2021-09-11 DIAGNOSIS — H02836 Dermatochalasis of left eye, unspecified eyelid: Secondary | ICD-10-CM | POA: Diagnosis not present

## 2021-09-28 ENCOUNTER — Telehealth: Payer: Self-pay | Admitting: Oncology

## 2021-09-28 NOTE — Telephone Encounter (Signed)
Called patient regarding upcoming August appointment, patient has been called and notified.  

## 2021-10-07 ENCOUNTER — Telehealth: Payer: Self-pay | Admitting: Oncology

## 2021-10-07 NOTE — Telephone Encounter (Signed)
Called patient regarding upcoming August appointments, patient has been called and voicemail was left. 

## 2021-10-14 DIAGNOSIS — I700 Atherosclerosis of aorta: Secondary | ICD-10-CM | POA: Diagnosis not present

## 2021-10-14 DIAGNOSIS — C7910 Secondary malignant neoplasm of unspecified urinary organs: Secondary | ICD-10-CM | POA: Diagnosis not present

## 2021-10-14 DIAGNOSIS — Z0000 Encounter for general adult medical examination without abnormal findings: Secondary | ICD-10-CM | POA: Diagnosis not present

## 2021-10-14 DIAGNOSIS — Z23 Encounter for immunization: Secondary | ICD-10-CM | POA: Diagnosis not present

## 2021-10-14 DIAGNOSIS — D631 Anemia in chronic kidney disease: Secondary | ICD-10-CM | POA: Diagnosis not present

## 2021-10-14 DIAGNOSIS — J4520 Mild intermittent asthma, uncomplicated: Secondary | ICD-10-CM | POA: Diagnosis not present

## 2021-10-14 DIAGNOSIS — N184 Chronic kidney disease, stage 4 (severe): Secondary | ICD-10-CM | POA: Diagnosis not present

## 2021-10-14 DIAGNOSIS — Z13220 Encounter for screening for lipoid disorders: Secondary | ICD-10-CM | POA: Diagnosis not present

## 2021-10-21 DIAGNOSIS — Z8551 Personal history of malignant neoplasm of bladder: Secondary | ICD-10-CM | POA: Diagnosis not present

## 2021-10-21 DIAGNOSIS — N131 Hydronephrosis with ureteral stricture, not elsewhere classified: Secondary | ICD-10-CM | POA: Diagnosis not present

## 2021-10-22 IMAGING — CT CT ABD-PELV W/O CM
2 of 4 series · 13 of 36 positions shown, 16 images · non-contrast
Comparison: 08/21/2020

CLINICAL DATA: CANCER RESTAGING.

EXAM:
CT CHEST, ABDOMEN AND PELVIS WITHOUT CONTRAST
TECHNIQUE: Multidetector CT imaging of the chest, abdomen and pelvis was
performed following the standard protocol without IV contrast.

[Series 2: cap w/o · axial · non-contrast · 0.84mm/px · z∈[-594,-79]mm · 10 of 127 slices shown, 13 images]
[im 12/127  mediastinal]
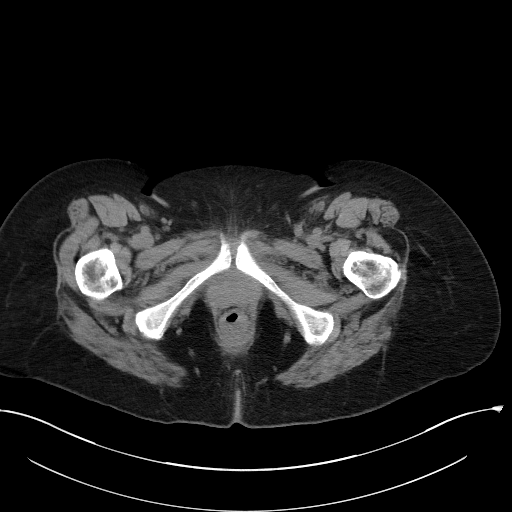
[im 12/127  lung]
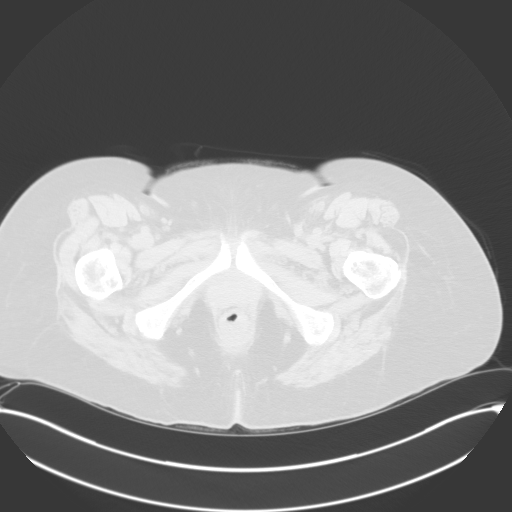
[im 23/127  lung]
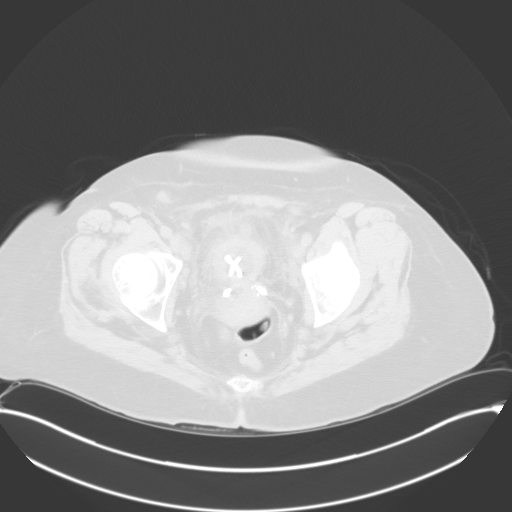
[im 35/127  lung]
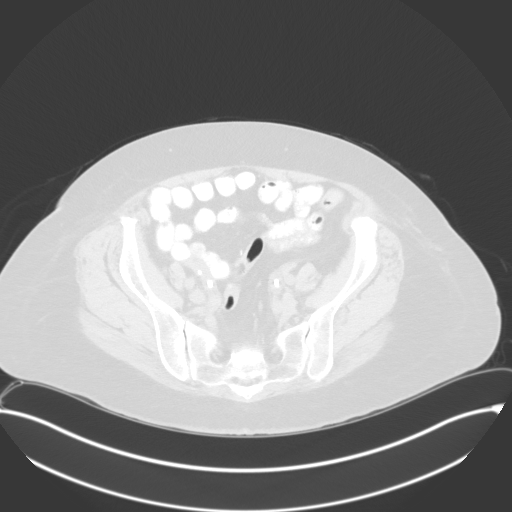
[im 46/127  lung]
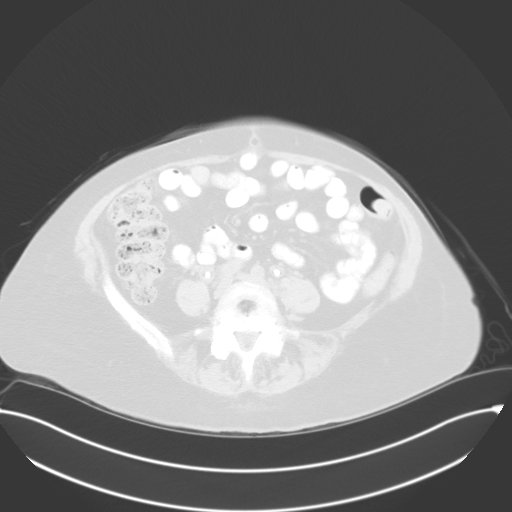
[im 58/127  mediastinal]
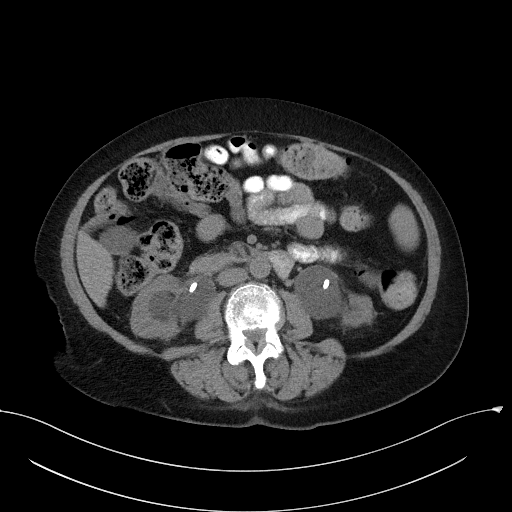
[im 58/127  lung]
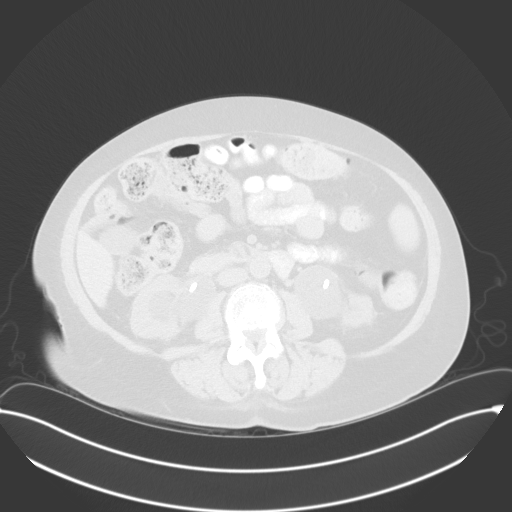
[im 69/127  lung]
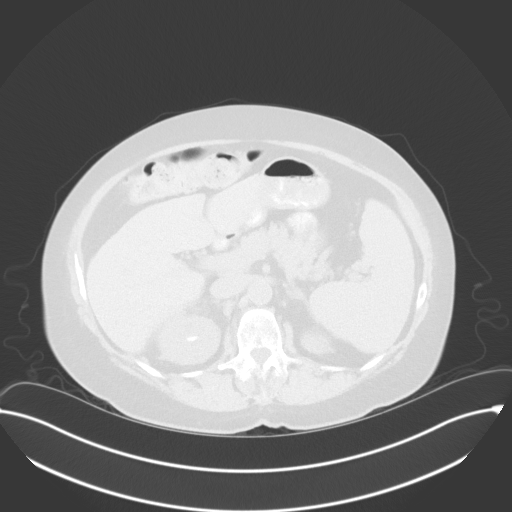
[im 81/127  lung]
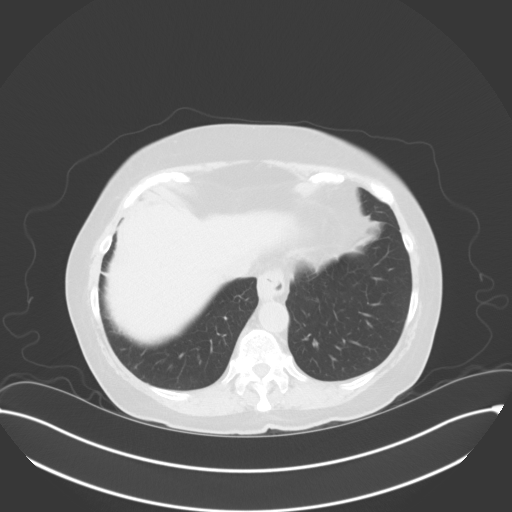
[im 92/127  lung]
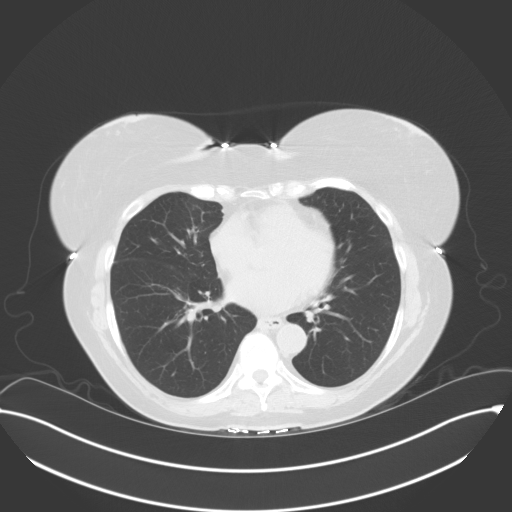
[im 104/127  mediastinal]
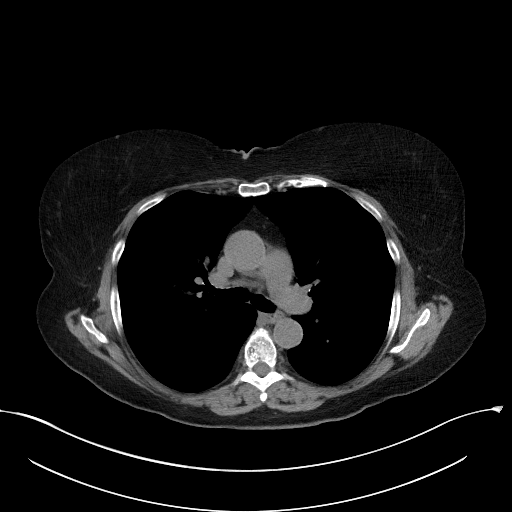
[im 104/127  lung]
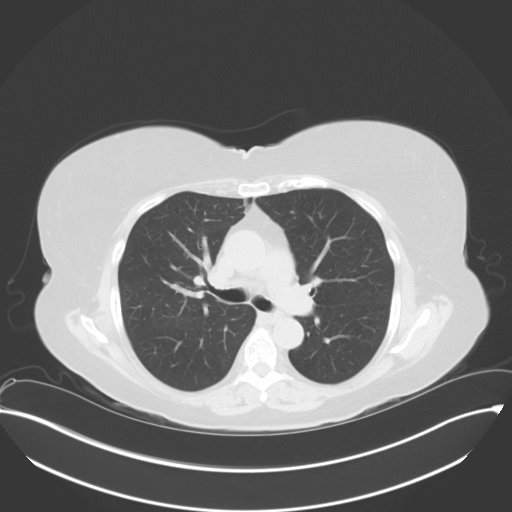
[im 115/127  lung]
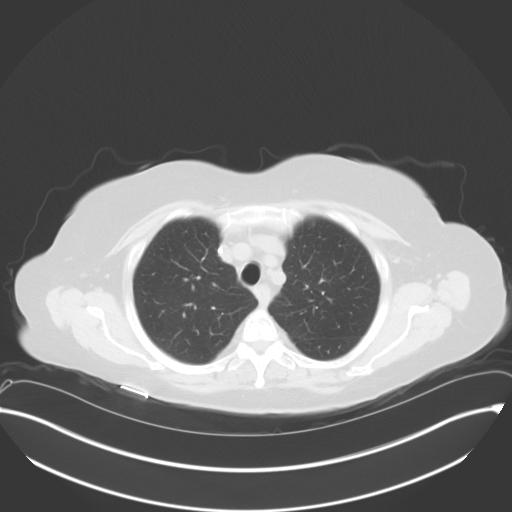

[Series 4: coronals · coronal · 0.82mm/px · 3 of 129 slices shown]
[im 26/129  lung]
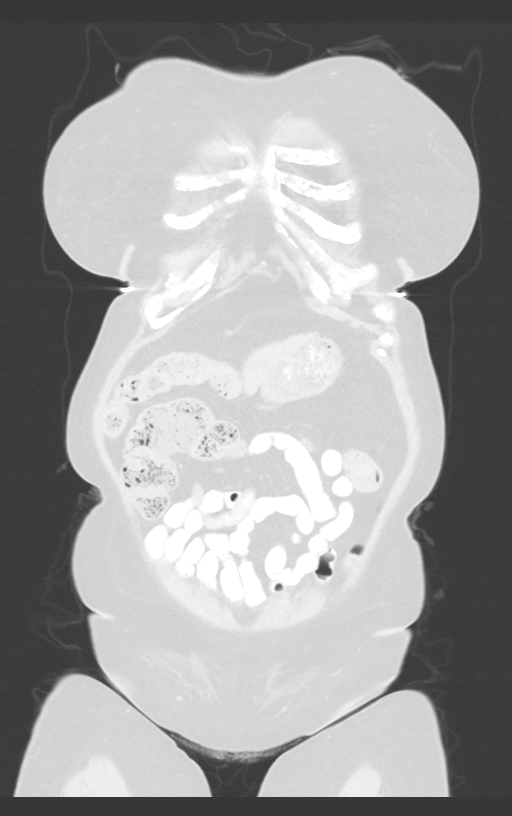
[im 52/129  lung]
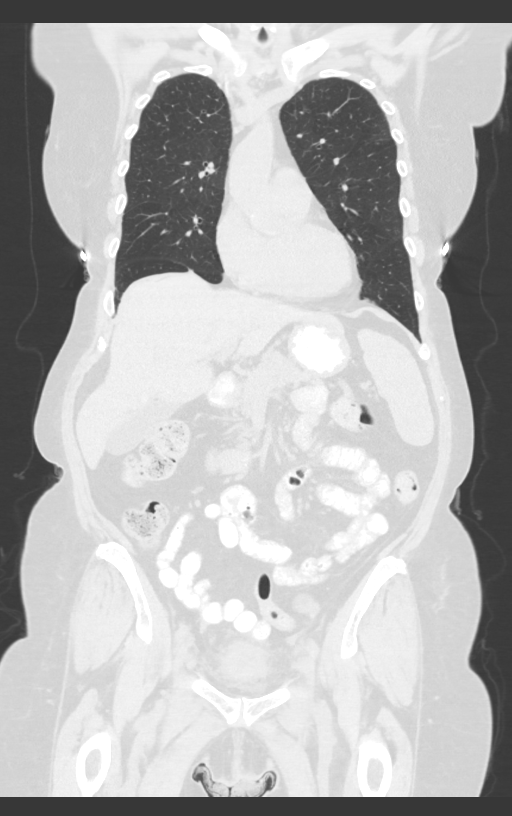
[im 77/129  lung]
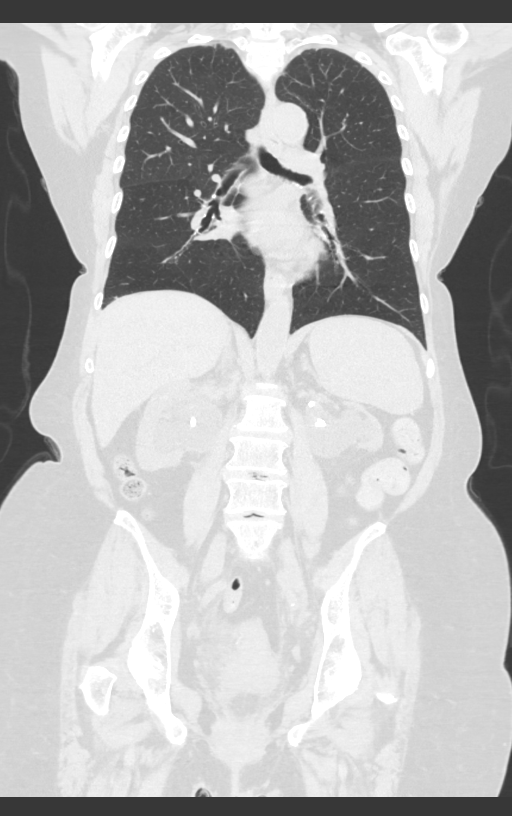

[13 of 36 positions shown; findings below may reference images not displayed]

FINDINGS: CT CHEST FINDINGS

Cardiovascular: The heart size is normal. No substantial pericardial
effusion. Coronary artery calcification is evident. Mild right
Port-A-Cath tip is in the mid SVC.

Mediastinum/Nodes: No mediastinal lymphadenopathy. No evidence for
gross hilar lymphadenopathy although assessment is limited by the
lack of intravenous contrast on today's study. The esophagus has
normal imaging features. There is no axillary lymphadenopathy.

Lungs/Pleura: No suspicious pulmonary nodule or mass. No focal
airspace consolidation. No pleural effusion.

Musculoskeletal: No worrisome lytic or sclerotic osseous
abnormality.

CT ABDOMEN PELVIS FINDINGS

Hepatobiliary: No focal abnormality in the liver on this study
without intravenous contrast. Subtle nodularity of liver contour
again noted. There is no evidence for gallstones, gallbladder wall
thickening, or pericholecystic fluid. No intrahepatic or
extrahepatic biliary dilation.

Pancreas: No focal mass lesion. No dilatation of the main duct. No
intraparenchymal cyst. No peripancreatic edema.

Spleen: No splenomegaly. No focal mass lesion.

Adrenals/Urinary Tract: No adrenal nodule or mass. Similar
appearance bilateral hydroureteronephrosis with bilateral internal
ureteral stents in situ. Marked cortical thinning in the left kidney
is again noted. Bladder is decompressed although there does appear
to be associated bladder wall thickening. The perivesical stranding
is similar to prior. 1.3 cm low attenuating nodular focus anterior
to the bladder on image 104/2 was 1.2 cm previously.

Stomach/Bowel: Tiny hiatal hernia. Stomach is unremarkable. No
gastric wall thickening. No evidence of outlet obstruction. Duodenum
is normally positioned as is the ligament of Treitz. No small bowel
wall thickening. No small bowel dilatation. The terminal ileum is
normal. The appendix is normal. No gross colonic mass. No colonic
wall thickening.

Vascular/Lymphatic: No abdominal aortic aneurysm. No abdominal
aortic atherosclerotic calcification. There is no gastrohepatic or
hepatoduodenal ligament lymphadenopathy. No retroperitoneal or
mesenteric lymphadenopathy. No pelvic sidewall lymphadenopathy.

Reproductive: Unremarkable.

Other: No intraperitoneal free fluid.

Musculoskeletal: No worrisome lytic or sclerotic osseous
abnormality.
IMPRESSION: 1. Stable exam. No new or progressive findings to suggest
progressive disease in the chest, abdomen, or pelvis.
2. Similar appearance bilateral hydroureteronephrosis with bilateral
internal ureteral stents in situ.
3. Similar appearance of bladder wall thickening with perivesical
stranding. 1.3 cm low attenuating nodular focus anterior to the
bladder is similar to prior. Continued attention on follow-up
recommended.
4. Tiny hiatal hernia.
5. Possible cirrhosis.
6. Aortic Atherosclerosis (332A7-[id number]).

## 2021-11-02 ENCOUNTER — Other Ambulatory Visit: Payer: Self-pay

## 2021-11-09 ENCOUNTER — Encounter: Payer: Self-pay | Admitting: Oncology

## 2021-11-20 ENCOUNTER — Encounter: Payer: Self-pay | Admitting: Oncology

## 2021-11-24 ENCOUNTER — Inpatient Hospital Stay: Payer: Medicare Other

## 2021-11-26 ENCOUNTER — Ambulatory Visit: Payer: Medicare Other | Admitting: Oncology

## 2021-11-30 ENCOUNTER — Encounter (HOSPITAL_COMMUNITY): Payer: Self-pay

## 2021-11-30 ENCOUNTER — Inpatient Hospital Stay: Payer: Medicare Other | Attending: Oncology

## 2021-11-30 ENCOUNTER — Ambulatory Visit (HOSPITAL_COMMUNITY)
Admission: RE | Admit: 2021-11-30 | Discharge: 2021-11-30 | Disposition: A | Discharge status: Home / Self Care | Payer: Medicare Other | Source: Ambulatory Visit | Attending: Oncology | Admitting: Oncology

## 2021-11-30 ENCOUNTER — Other Ambulatory Visit: Payer: Self-pay

## 2021-11-30 DIAGNOSIS — C679 Malignant neoplasm of bladder, unspecified: Secondary | ICD-10-CM | POA: Diagnosis not present

## 2021-11-30 DIAGNOSIS — G629 Polyneuropathy, unspecified: Secondary | ICD-10-CM | POA: Insufficient documentation

## 2021-11-30 DIAGNOSIS — Z8551 Personal history of malignant neoplasm of bladder: Secondary | ICD-10-CM | POA: Insufficient documentation

## 2021-11-30 DIAGNOSIS — N1330 Unspecified hydronephrosis: Secondary | ICD-10-CM | POA: Insufficient documentation

## 2021-11-30 DIAGNOSIS — N134 Hydroureter: Secondary | ICD-10-CM | POA: Diagnosis not present

## 2021-11-30 DIAGNOSIS — Z7901 Long term (current) use of anticoagulants: Secondary | ICD-10-CM | POA: Insufficient documentation

## 2021-11-30 DIAGNOSIS — R911 Solitary pulmonary nodule: Secondary | ICD-10-CM | POA: Diagnosis not present

## 2021-11-30 LAB — CBC WITH DIFFERENTIAL (CANCER CENTER ONLY)
Abs Immature Granulocytes: 0.02 10*3/uL (ref 0.00–0.07)
Basophils Absolute: 0.1 10*3/uL (ref 0.0–0.1)
Basophils Relative: 1 %
Eosinophils Absolute: 0.3 10*3/uL (ref 0.0–0.5)
Eosinophils Relative: 5 %
HCT: 40.2 % (ref 36.0–46.0)
Hemoglobin: 13.2 g/dL (ref 12.0–15.0)
Immature Granulocytes: 0 %
Lymphocytes Relative: 18 %
Lymphs Abs: 1.1 10*3/uL (ref 0.7–4.0)
MCH: 30.4 pg (ref 26.0–34.0)
MCHC: 32.8 g/dL (ref 30.0–36.0)
MCV: 92.6 fL (ref 80.0–100.0)
Monocytes Absolute: 0.4 10*3/uL (ref 0.1–1.0)
Monocytes Relative: 7 %
Neutro Abs: 4.3 10*3/uL (ref 1.7–7.7)
Neutrophils Relative %: 69 %
Platelet Count: 179 10*3/uL (ref 150–400)
RBC: 4.34 MIL/uL (ref 3.87–5.11)
RDW: 13.2 % (ref 11.5–15.5)
WBC Count: 6.2 10*3/uL (ref 4.0–10.5)
nRBC: 0 % (ref 0.0–0.2)

## 2021-11-30 LAB — CMP (CANCER CENTER ONLY)
ALT: 7 U/L (ref 0–44)
AST: 18 U/L (ref 15–41)
Albumin: 4.4 g/dL (ref 3.5–5.0)
Alkaline Phosphatase: 101 U/L (ref 38–126)
Anion gap: 8 (ref 5–15)
BUN: 26 mg/dL — ABNORMAL HIGH (ref 8–23)
CO2: 22 mmol/L (ref 22–32)
Calcium: 9.9 mg/dL (ref 8.9–10.3)
Chloride: 109 mmol/L (ref 98–111)
Creatinine: 1.82 mg/dL — ABNORMAL HIGH (ref 0.44–1.00)
GFR, Estimated: 28 mL/min — ABNORMAL LOW (ref >60)
Glucose, Bld: 89 mg/dL (ref 70–99)
Potassium: 4.6 mmol/L (ref 3.5–5.1)
Sodium: 139 mmol/L (ref 135–145)
Total Bilirubin: 0.5 mg/dL (ref 0.3–1.2)
Total Protein: 7.8 g/dL (ref 6.5–8.1)

## 2021-12-03 ENCOUNTER — Other Ambulatory Visit: Payer: Self-pay

## 2021-12-03 ENCOUNTER — Inpatient Hospital Stay: Payer: Medicare Other | Admitting: Oncology

## 2021-12-03 VITALS — BP 134/52 | HR 95 | Temp 97.9°F | Resp 17 | Ht 64.0 in | Wt 191.1 lb

## 2021-12-03 DIAGNOSIS — C679 Malignant neoplasm of bladder, unspecified: Secondary | ICD-10-CM

## 2021-12-03 DIAGNOSIS — Z8551 Personal history of malignant neoplasm of bladder: Secondary | ICD-10-CM | POA: Diagnosis not present

## 2021-12-03 DIAGNOSIS — Z7901 Long term (current) use of anticoagulants: Secondary | ICD-10-CM | POA: Diagnosis not present

## 2021-12-03 DIAGNOSIS — G629 Polyneuropathy, unspecified: Secondary | ICD-10-CM | POA: Diagnosis not present

## 2021-12-03 DIAGNOSIS — N1330 Unspecified hydronephrosis: Secondary | ICD-10-CM | POA: Diagnosis not present

## 2021-12-03 NOTE — Progress Notes (Signed)
Hematology and Oncology Follow Up Visit  Alexandra Little [id number] [birth year] 76 y.o. 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  Prior Therapy:  She was treated with carboplatin and gemcitabine with the last infusion in March 2021.  She had severe cytopenias after 5 cycles of therapy and was switched to Avelumab in April 2021.    In July 2021 showed worsening of disease occluding retroperitoneal and inguinal adenopathy.  He subsequently was switched to [facility] in September 2021 therapy was poorly tolerated at day 1, 8 and 15 at 28-day cycle.  He subsequently was switched to 1 mg/kg day 1 day 15 out of 28-day cycle.    Last CT scan in February 2022 showed near complete response.   Padcev 1 mg/kg day 1 and day 15 of each cycle.  She received day 1 on July 17, 2020 and therapy has been on hold due to neuropathy and complete response.   Current therapy: She is currently off treatment  Interim History: [name] presents today for return evaluation.  Since last visit, she reports feeling well without any major complaints.  She denies any nausea, vomiting or abdominal pain.  She denies any worsening neuropathy.  She does have residual neuropathy in her lower extremity but has not progressed.  Her Port-A-Cath was removed without complications.      Medications: Reviewed without changes. Current Outpatient Medications  Medication Sig Dispense Refill   acetaminophen (TYLENOL) 500 MG tablet Take 1,000 mg by mouth every 6 (six) hours as needed for mild pain or headache.     albuterol (VENTOLIN HFA) 108 (90 Base) MCG/ACT inhaler Inhale 2 puffs into the lungs every 4 (four) hours as needed for shortness of breath.     amoxicillin (AMOXIL) 500 MG tablet Take 1 tablet (500 mg total) by mouth 2 (two) times daily. 8 tablet 0   apixaban (ELIQUIS) 5 MG  TABS tablet TAKE 1 TABLET(5 MG) BY MOUTH TWICE DAILY 60 tablet 3   diphenhydrAMINE (BENADRYL) 25 mg capsule Take 25 mg by mouth every 6 (six) hours as needed for itching.     famotidine-calcium carbonate-magnesium hydroxide (PEPCID COMPLETE) [phone] MG chewable tablet Chew 1 tablet by mouth 2 (two) times daily as needed (heartburn/indigestion.).     hydrOXYzine (ATARAX) 10 MG tablet Take 1 tablet (10 mg total) by mouth every 6 (six) hours as needed. 60 tablet 3   Meth-Hyo-M Bl-Na Phos-Ph Sal (URIBEL) 118 MG CAPS Take 1 capsule (118 mg total) by mouth every 8 (eight) hours as needed. 30 capsule 5   MYRBETRIQ 50 MG TB24 tablet Take 50 mg by mouth daily.     No current facility-administered medications for this visit.     Allergies:  Allergies  Allergen Reactions   Lisinopril Anaphylaxis   Chlorhexidine Itching   Fish Oil     Other reaction(s): tongue/lip swelling   Iodine Swelling    Patient states she either breaks out in hives or her tongue swells   Latex Itching   Salmon Oil [Nutritional Supplements] Swelling      Physical Exam:      Blood pressure (!) 134/52, pulse 95, temperature 97.9 F (36.6 C), temperature source Temporal, resp. rate 17, height '5\' 4"'$  (1.626 m), weight 191 lb 1.6 oz (86.7 kg), SpO2 99 %.       ECOG: 1   General appearance: Comfortable appearing without  any discomfort Head: Normocephalic without any trauma Oropharynx: Mucous membranes are moist and pink without any thrush or ulcers. Eyes: Pupils are equal and round reactive to light. Lymph nodes: No cervical, supraclavicular, inguinal or axillary lymphadenopathy.   Heart:regular rate and rhythm.  S1 and S2 without leg edema. Lung: Clear without any rhonchi or wheezes.  No dullness to percussion. Abdomin: Soft, nontender, nondistended with good bowel sounds.  No hepatosplenomegaly. Musculoskeletal: No joint deformity or effusion.  Full range of motion noted. Neurological: No deficits noted on  motor, sensory and deep tendon reflex exam. Skin: No petechial rash or dryness.  Appeared moist.             Lab Results: Lab Results  Component Value Date   WBC 6.2 11/30/2021   HGB 13.2 11/30/2021   HCT 40.2 11/30/2021   MCV 92.6 11/30/2021   PLT 179 11/30/2021     Chemistry      Component Value Date/Time   NA 139 11/30/2021 1442   K 4.6 11/30/2021 1442   CL 109 11/30/2021 1442   CO2 22 11/30/2021 1442   BUN 26 (H) 11/30/2021 1442   CREATININE 1.82 (H) 11/30/2021 1442      Component Value Date/Time   CALCIUM 9.9 11/30/2021 1442   ALKPHOS 101 11/30/2021 1442   AST 18 11/30/2021 1442   ALT 7 11/30/2021 1442   BILITOT 0.5 11/30/2021 1442       Impression and Plan:   76 year old woman with:   1.   Stage IV high-grade urothelial carcinoma of the bladder with pelvic adenopathy diagnosed in 2021.  The natural course of her disease was reviewed at this time and treatment choices were discussed.  CT scan obtained on November 30, 2021 was personally reviewed and showed overall stable disease without any progression.  Salvage therapy options including reintroducing Padcev versus FGFR inhibitors.  We will update her staging scans in December.  We will proceed with a PET scan given the questionable borderline lymph node.   2.  IV access: Port-A-Cath removed at this time.  This will be inserted if needed in the future.     3.  Hydronephrosis: she continues to have follow-up with Dr. Alinda Money with stent replacement as needed.  4.  Thromboembolism: He does not have any recurrent thrombosis at this time.   5.  Neuropathy: Continue to be stable without any progression.   6.  Follow-up: In 4 months for a follow-up.   30  minutes were dedicated to this encounter.  The time was spent on reviewing laboratory data, imaging studies, treatment choices and future plan of care management options.  Zola Button, MD 8/24/20238:20 AM

## 2021-12-04 ENCOUNTER — Other Ambulatory Visit: Payer: Self-pay

## 2021-12-05 ENCOUNTER — Other Ambulatory Visit: Payer: Self-pay

## 2021-12-07 ENCOUNTER — Encounter: Payer: Self-pay | Admitting: Oncology

## 2021-12-09 ENCOUNTER — Encounter: Payer: Self-pay | Admitting: *Deleted

## 2021-12-24 ENCOUNTER — Ambulatory Visit (HOSPITAL_COMMUNITY)
Admission: RE | Admit: 2021-12-24 | Discharge: 2021-12-24 | Disposition: A | Discharge status: Home / Self Care | Payer: Medicare Other | Source: Ambulatory Visit | Attending: Oncology | Admitting: Oncology

## 2021-12-24 DIAGNOSIS — C679 Malignant neoplasm of bladder, unspecified: Secondary | ICD-10-CM | POA: Insufficient documentation

## 2021-12-24 LAB — GLUCOSE, CAPILLARY: Glucose-Capillary: 106 mg/dL — ABNORMAL HIGH (ref 70–99)

## 2021-12-24 MED ORDER — FLUDEOXYGLUCOSE F - 18 (FDG) INJECTION
9.4600 | Freq: Once | INTRAVENOUS | Status: AC | PRN
  Administered 2021-12-24: 9.46 via INTRAVENOUS

## 2021-12-28 ENCOUNTER — Other Ambulatory Visit: Payer: Self-pay | Admitting: Oncology

## 2021-12-28 DIAGNOSIS — C679 Malignant neoplasm of bladder, unspecified: Secondary | ICD-10-CM

## 2021-12-28 NOTE — Progress Notes (Signed)
The results of the PET scan obtained on 12/24/2021 were personally reviewed and discussed with the patient.  This scan showed evidence of disease progression in 3 spots of concern predominantly in the hepatic region as well as the spleen.  Risks and benefits of restarting therapy were discussed at this time.  Treatment choices that include Padcev versus erdafitinib were reviewed.  After discussion today, we opted to proceed with Padcev given her reasonable tolerance to it previously as well as excellent response.  Complications that include nausea, vomiting, myelosuppression and neuropathy were reiterated.  We will proceed with the treatment in the near future.  Peripheral veins will be in use and a Port-A-Cath option will be deferred for the time being.

## 2021-12-28 NOTE — Progress Notes (Signed)
ON PATHWAY REGIMEN - Bladder  No Change  Continue With Treatment as Ordered.  Original Decision Date/Time: 06/12/2020 15:14     A cycle is every 28 days:     Enfortumab vedotin-ejfv   **Always confirm dose/schedule in your pharmacy ordering system**  Patient Characteristics: Advanced/Metastatic Disease, Third Line and Beyond Therapeutic Status: Advanced/Metastatic Disease Line of Therapy: Third Line and Beyond  Intent of Therapy: Non-Curative / Palliative Intent, Discussed with Patient

## 2021-12-28 NOTE — Progress Notes (Signed)
DISCONTINUE ON PATHWAY REGIMEN - Bladder     A cycle is every 28 days:     Enfortumab vedotin-ejfv   **Always confirm dose/schedule in your pharmacy ordering system**  REASON: Disease Progression PRIOR TREATMENT: [id number]: Enfortumab vedotin 1.25 mg/kg D1, 8, 15 q28 Days Until Progression or Unacceptable Toxicity TREATMENT RESPONSE: Complete Response (CR)  START ON PATHWAY REGIMEN - Bladder     A cycle is every 28 days:     Enfortumab vedotin-ejfv   **Always confirm dose/schedule in your pharmacy ordering system**  Patient Characteristics: Advanced/Metastatic Disease, Third Line and Beyond Therapeutic Status: Advanced/Metastatic Disease Line of Therapy: Third Line and Beyond  Intent of Therapy: Non-Curative / Palliative Intent, Discussed with Patient

## 2021-12-29 ENCOUNTER — Other Ambulatory Visit: Payer: Self-pay | Admitting: Oncology

## 2021-12-29 ENCOUNTER — Other Ambulatory Visit: Payer: Self-pay

## 2022-01-06 DIAGNOSIS — J4520 Mild intermittent asthma, uncomplicated: Secondary | ICD-10-CM | POA: Diagnosis not present

## 2022-01-17 ENCOUNTER — Other Ambulatory Visit: Payer: Self-pay

## 2022-01-18 ENCOUNTER — Inpatient Hospital Stay: Payer: Medicare Other | Attending: Oncology

## 2022-01-18 ENCOUNTER — Inpatient Hospital Stay: Payer: Medicare Other

## 2022-01-18 ENCOUNTER — Inpatient Hospital Stay: Payer: Medicare Other | Admitting: Oncology

## 2022-01-18 ENCOUNTER — Other Ambulatory Visit: Payer: Self-pay

## 2022-01-18 DIAGNOSIS — T451X5A Adverse effect of antineoplastic and immunosuppressive drugs, initial encounter: Secondary | ICD-10-CM | POA: Insufficient documentation

## 2022-01-18 DIAGNOSIS — G620 Drug-induced polyneuropathy: Secondary | ICD-10-CM | POA: Insufficient documentation

## 2022-01-18 DIAGNOSIS — J45901 Unspecified asthma with (acute) exacerbation: Secondary | ICD-10-CM | POA: Diagnosis not present

## 2022-01-18 DIAGNOSIS — Z79899 Other long term (current) drug therapy: Secondary | ICD-10-CM | POA: Insufficient documentation

## 2022-01-18 DIAGNOSIS — Z5112 Encounter for antineoplastic immunotherapy: Secondary | ICD-10-CM | POA: Insufficient documentation

## 2022-01-18 DIAGNOSIS — C679 Malignant neoplasm of bladder, unspecified: Secondary | ICD-10-CM | POA: Diagnosis not present

## 2022-01-18 LAB — CMP (CANCER CENTER ONLY)
ALT: 8 U/L (ref 0–44)
AST: 17 U/L (ref 15–41)
Albumin: 4.2 g/dL (ref 3.5–5.0)
Alkaline Phosphatase: 97 U/L (ref 38–126)
Anion gap: 8 (ref 5–15)
BUN: 31 mg/dL — ABNORMAL HIGH (ref 8–23)
CO2: 22 mmol/L (ref 22–32)
Calcium: 9.4 mg/dL (ref 8.9–10.3)
Chloride: 108 mmol/L (ref 98–111)
Creatinine: 1.81 mg/dL — ABNORMAL HIGH (ref 0.44–1.00)
GFR, Estimated: 29 mL/min — ABNORMAL LOW (ref >60)
Glucose, Bld: 123 mg/dL — ABNORMAL HIGH (ref 70–99)
Potassium: 4.4 mmol/L (ref 3.5–5.1)
Sodium: 138 mmol/L (ref 135–145)
Total Bilirubin: 0.6 mg/dL (ref 0.3–1.2)
Total Protein: 7.8 g/dL (ref 6.5–8.1)

## 2022-01-18 LAB — CBC WITH DIFFERENTIAL (CANCER CENTER ONLY)
Abs Immature Granulocytes: 0.03 10*3/uL (ref 0.00–0.07)
Basophils Absolute: 0 10*3/uL (ref 0.0–0.1)
Basophils Relative: 1 %
Eosinophils Absolute: 0.6 10*3/uL — ABNORMAL HIGH (ref 0.0–0.5)
Eosinophils Relative: 8 %
HCT: 36.9 % (ref 36.0–46.0)
Hemoglobin: 12.2 g/dL (ref 12.0–15.0)
Immature Granulocytes: 0 %
Lymphocytes Relative: 8 %
Lymphs Abs: 0.6 10*3/uL — ABNORMAL LOW (ref 0.7–4.0)
MCH: 31.2 pg (ref 26.0–34.0)
MCHC: 33.1 g/dL (ref 30.0–36.0)
MCV: 94.4 fL (ref 80.0–100.0)
Monocytes Absolute: 0.6 10*3/uL (ref 0.1–1.0)
Monocytes Relative: 7 %
Neutro Abs: 5.6 10*3/uL (ref 1.7–7.7)
Neutrophils Relative %: 76 %
Platelet Count: 158 10*3/uL (ref 150–400)
RBC: 3.91 MIL/uL (ref 3.87–5.11)
RDW: 13.7 % (ref 11.5–15.5)
WBC Count: 7.4 10*3/uL (ref 4.0–10.5)
nRBC: 0 % (ref 0.0–0.2)

## 2022-01-18 MED ORDER — PREDNISONE 20 MG PO TABS: 20.0000 mg | ORAL_TABLET | Freq: Every day | ORAL | 1 refills | Status: DC

## 2022-01-18 MED ORDER — GUAIFENESIN ER 600 MG PO TB12: 600.0000 mg | ORAL_TABLET | Freq: Two times a day (BID) | ORAL | 1 refills | Status: AC

## 2022-01-18 NOTE — Progress Notes (Signed)
Hematology and Oncology Follow Up Visit  Alexandra Little [id number] 1945-07-24 75 y.o. 01/18/2022 9:07 AM Alexandra Little, MDShadad, Alexandra Dad, MD   Principle Diagnosis: 44 year old woman with bladder cancer diagnosed in July 2021.  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  Prior Therapy:  She was treated with carboplatin and gemcitabine with the last infusion in March 2021.  She had severe cytopenias after 5 cycles of therapy and was switched to Avelumab in April 2021.    In July 2021 showed worsening of disease occluding retroperitoneal and inguinal adenopathy.  He subsequently was switched to [facility] in September 2021 therapy was poorly tolerated at day 1, 8 and 15 at 28-day cycle.  He subsequently was switched to 1 mg/kg day 1 day 15 out of 28-day cycle.    Last CT scan in February 2022 showed near complete response.   Padcev 1 mg/kg day 1 and day 15 of each cycle.  She received day 1 on July 17, 2020 and therapy has been on hold due to neuropathy and complete response.   Current therapy: Padcev 1 mg/kg day 1 and day 15 of each cycle.  Therapy to resume on January 18, 2022.  Interim History: Alexandra Little returns today for repeat evaluation.  Since the last visit, she completed PET scan showed evidence of disease progression.  Clinically, she has reported symptoms of wheezing and shortness of breath as well as nasal congestion for the last few weeks.  She was prescribed a course of prednisone for 3 days which have helped her symptoms temporarily.  She denies any fevers or chills or sweats.  She denies any abdominal pain or discomfort.      Medications: Updated on review. Current Outpatient Medications  Medication Sig Dispense Refill   acetaminophen (TYLENOL) 500 MG tablet Take 1,000 mg by mouth every 6 (six) hours as needed for mild pain or headache.     albuterol (VENTOLIN HFA) 108 (90 Base) MCG/ACT inhaler Inhale 2 puffs into the  lungs every 4 (four) hours as needed for shortness of breath.     amoxicillin (AMOXIL) 500 MG tablet Take 1 tablet (500 mg total) by mouth 2 (two) times daily. 8 tablet 0   apixaban (ELIQUIS) 5 MG TABS tablet TAKE 1 TABLET(5 MG) BY MOUTH TWICE DAILY 60 tablet 3   diphenhydrAMINE (BENADRYL) 25 mg capsule Take 25 mg by mouth every 6 (six) hours as needed for itching.     famotidine-calcium carbonate-magnesium hydroxide (PEPCID COMPLETE) 10-800-165 MG chewable tablet Chew 1 tablet by mouth 2 (two) times daily as needed (heartburn/indigestion.).     hydrOXYzine (ATARAX) 10 MG tablet Take 1 tablet (10 mg total) by mouth every 6 (six) hours as needed. 60 tablet 3   Meth-Hyo-M Bl-Na Phos-Ph Sal (URIBEL) 118 MG CAPS Take 1 capsule (118 mg total) by mouth every 8 (eight) hours as needed. 30 capsule 5   MYRBETRIQ 50 MG TB24 tablet Take 50 mg by mouth daily.     No current facility-administered medications for this visit.     Allergies:  Allergies  Allergen Reactions   Lisinopril Anaphylaxis   Chlorhexidine Itching   Fish Oil     Other reaction(s): tongue/lip swelling   Iodine Swelling    Patient states she either breaks out in hives or her tongue swells   Latex Itching   Salmon Oil [Nutritional Supplements] Swelling      Physical Exam:      Blood pressure Marland Kitchen)  157/84, pulse 98, temperature 97.8 F (36.6 C), temperature source Temporal, resp. rate 18, height '5\' 4"'$  (1.626 m), weight 193 lb 8 oz (87.8 kg), SpO2 97 %.        ECOG: 1    General appearance: Alert, awake without any distress. Head: Atraumatic without abnormalities Oropharynx: Without any thrush or ulcers. Eyes: No scleral icterus. Lymph nodes: No lymphadenopathy noted in the cervical, supraclavicular, or axillary nodes Heart:regular rate and rhythm, without any murmurs or gallops.   Lung: Wheezing noted in all lung fields. Abdomin: Soft, nontender without any shifting dullness or ascites. Musculoskeletal: No  clubbing or cyanosis. Neurological: No motor or sensory deficits. Skin: No rashes or lesions.            Lab Results: Lab Results  Component Value Date   WBC 6.2 11/30/2021   HGB 13.2 11/30/2021   HCT 40.2 11/30/2021   MCV 92.6 11/30/2021   PLT 179 11/30/2021     Chemistry      Component Value Date/Time   NA 139 11/30/2021 1442   K 4.6 11/30/2021 1442   CL 109 11/30/2021 1442   CO2 22 11/30/2021 1442   BUN 26 (H) 11/30/2021 1442   CREATININE 1.82 (H) 11/30/2021 1442      Component Value Date/Time   CALCIUM 9.9 11/30/2021 1442   ALKPHOS 101 11/30/2021 1442   AST 18 11/30/2021 1442   ALT 7 11/30/2021 1442   BILITOT 0.5 11/30/2021 1442     1. Three separate foci of intense hypermetabolic activity in the upper abdomen involving the liver, porta hepatis and splenic hilum, highly suspicious for metastatic disease. Findings are difficult to discern on noncontrast CT images. Abdominal MRI without and with contrast may be helpful for further characterization of these lesions which could be peritoneal based (although there is no ascites). 2. No evidence of metastatic disease in the chest. 3. Diffuse esophageal activity, likely due to reflux/esophagitis. 4. Unchanged hydronephrosis and chronic bladder wall thickening.    Impression and Plan:   76 year old woman with:   1.   Bladder cancer diagnosed in 2021 with pelvic adenopathy indicating stage IV high-grade urothelial carcinoma.  Risks and benefits of restarting Padcev chemotherapy were discussed.  Hyperglycemia and neuropathy were reiterated.  Alternative options including FGFR inhibitors which associated with GI toxicity, rash and diarrhea.  After discussion today she opted to delay the start of treatment given her respiratory symptoms.  She will start in 2 weeks.   2.  IV access: Port-A-Cath has been removed and peripheral veins are currently in use.  Risks and benefits of inserting Port-A-Cath were discussed.   Peripheral veins will continue to be in use.   3.  Nasal congestion and asthma exacerbation: I will give a short course of prednisone as well as a decongestion.  I recommended pulmonary referral as well.  4.  Hydronephrosis: she continues to have stent in place and will be replaced periodically.  5.  Peripheral neuropathy: We will continue to monitor on Padcev.   6.  Follow-up: In 2 weeks for the start of day 1 cycle 1 of therapy.   30  minutes were dedicated to this visit.  The time was spent on reviewing laboratory data, disease status update and outlining future plan of care review. Zola Button, MD 10/9/20239:07 AM

## 2022-01-25 NOTE — Progress Notes (Signed)
Pharmacist Chemotherapy Monitoring - Initial Assessment    Anticipated start date: 02/01/22  The following has been reviewed per standard work regarding the patient's treatment regimen: The patient's diagnosis, treatment plan and drug doses, and organ/hematologic function Lab orders and baseline tests specific to treatment regimen  The treatment plan start date, drug sequencing, and pre-medications Prior authorization status  Patient's documented medication list, including drug-drug interaction screen and prescriptions for anti-emetics and supportive care specific to the treatment regimen The drug concentrations, fluid compatibility, administration routes, and timing of the medications to be used The patient's access for treatment and lifetime cumulative dose history, if applicable  The patient's medication allergies and previous infusion related reactions, if applicable   Changes made to treatment plan:  N/A  Follow up needed:  N/A   Larene Beach, RPH, 01/25/2022  3:10 PM

## 2022-02-01 ENCOUNTER — Inpatient Hospital Stay: Payer: Medicare Other

## 2022-02-01 VITALS — BP 140/86 | HR 77 | Temp 98.7°F | Resp 17 | Wt 195.0 lb

## 2022-02-01 DIAGNOSIS — T451X5A Adverse effect of antineoplastic and immunosuppressive drugs, initial encounter: Secondary | ICD-10-CM | POA: Diagnosis not present

## 2022-02-01 DIAGNOSIS — C679 Malignant neoplasm of bladder, unspecified: Secondary | ICD-10-CM | POA: Diagnosis not present

## 2022-02-01 DIAGNOSIS — Z5112 Encounter for antineoplastic immunotherapy: Secondary | ICD-10-CM | POA: Diagnosis not present

## 2022-02-01 DIAGNOSIS — J45901 Unspecified asthma with (acute) exacerbation: Secondary | ICD-10-CM | POA: Diagnosis not present

## 2022-02-01 DIAGNOSIS — G620 Drug-induced polyneuropathy: Secondary | ICD-10-CM | POA: Diagnosis not present

## 2022-02-01 DIAGNOSIS — Z79899 Other long term (current) drug therapy: Secondary | ICD-10-CM | POA: Diagnosis not present

## 2022-02-01 LAB — CBC WITH DIFFERENTIAL (CANCER CENTER ONLY)
Abs Immature Granulocytes: 0.07 10*3/uL (ref 0.00–0.07)
Basophils Absolute: 0 10*3/uL (ref 0.0–0.1)
Basophils Relative: 0 %
Eosinophils Absolute: 0.1 10*3/uL (ref 0.0–0.5)
Eosinophils Relative: 2 %
HCT: 38.8 % (ref 36.0–46.0)
Hemoglobin: 12.5 g/dL (ref 12.0–15.0)
Immature Granulocytes: 1 %
Lymphocytes Relative: 17 %
Lymphs Abs: 1.2 10*3/uL (ref 0.7–4.0)
MCH: 30.6 pg (ref 26.0–34.0)
MCHC: 32.2 g/dL (ref 30.0–36.0)
MCV: 94.9 fL (ref 80.0–100.0)
Monocytes Absolute: 0.6 10*3/uL (ref 0.1–1.0)
Monocytes Relative: 8 %
Neutro Abs: 5.2 10*3/uL (ref 1.7–7.7)
Neutrophils Relative %: 72 %
Platelet Count: 177 10*3/uL (ref 150–400)
RBC: 4.09 MIL/uL (ref 3.87–5.11)
RDW: 13.6 % (ref 11.5–15.5)
WBC Count: 7.2 10*3/uL (ref 4.0–10.5)
nRBC: 0 % (ref 0.0–0.2)

## 2022-02-01 LAB — CMP (CANCER CENTER ONLY)
ALT: 9 U/L (ref 0–44)
AST: 15 U/L (ref 15–41)
Albumin: 3.8 g/dL (ref 3.5–5.0)
Alkaline Phosphatase: 77 U/L (ref 38–126)
Anion gap: 5 (ref 5–15)
BUN: 26 mg/dL — ABNORMAL HIGH (ref 8–23)
CO2: 24 mmol/L (ref 22–32)
Calcium: 8.9 mg/dL (ref 8.9–10.3)
Chloride: 110 mmol/L (ref 98–111)
Creatinine: 1.86 mg/dL — ABNORMAL HIGH (ref 0.44–1.00)
GFR, Estimated: 28 mL/min — ABNORMAL LOW (ref >60)
Glucose, Bld: 121 mg/dL — ABNORMAL HIGH (ref 70–99)
Potassium: 4.6 mmol/L (ref 3.5–5.1)
Sodium: 139 mmol/L (ref 135–145)
Total Bilirubin: 0.5 mg/dL (ref 0.3–1.2)
Total Protein: 6.6 g/dL (ref 6.5–8.1)

## 2022-02-01 MED ORDER — SODIUM CHLORIDE 0.9 % IV SOLN: Freq: Once | INTRAVENOUS | Status: AC

## 2022-02-01 MED ORDER — SODIUM CHLORIDE 0.9 % IV SOLN
90.0000 mg | Freq: Once | INTRAVENOUS | Status: AC
  Administered 2022-02-01: 90 mg via INTRAVENOUS
  Filled 2022-02-01: qty 9

## 2022-02-01 MED ORDER — PROCHLORPERAZINE MALEATE 10 MG PO TABS
10.0000 mg | ORAL_TABLET | Freq: Once | ORAL | Status: AC
  Administered 2022-02-01: 10 mg via ORAL
  Filled 2022-02-01: qty 1

## 2022-02-01 NOTE — Patient Instructions (Signed)
[facility]ICAL [facility]COLOG[facility]  Discharge Instructions: Thank you for choosing [facility] to provide your oncology and hematology care.   If you have a lab appointment with the Cancer Center, please go directly to the Cancer Center and check in at the registration area.   Wear comfortable clothing and clothing appropriate for easy access to any Portacath or PICC line.   We strive to give you quality time with your provider. You may need to reschedule your appointment if you arrive late (15 or more minutes).  Arriving late affects you and other patients whose appointments are after yours.  Also, if you miss three or more appointments without notifying the office, you may be dismissed from the clinic at the provider's discretion.      For prescription refill requests, have your pharmacy contact our office and allow 72 hours for refills to be completed.    Today you received the following chemotherapy and/or immunotherapy agents: Padcev      To help prevent nausea and vomiting after your treatment, we encourage you to take your nausea medication as directed.  BELOW ARE SYMPTOMS THAT SHOULD BE REPORTED IMMEDIATELY: *FEVER GREATER THAN 100.4 F (38 C) OR HIGHER *CHILLS OR SWEATING *NAUSEA AND VOMITING THAT IS NOT CONTROLLED WITH YOUR NAUSEA MEDICATION *UNUSUAL SHORTNESS OF BREATH *UNUSUAL BRUISING OR BLEEDING *URINARY PROBLEMS (pain or burning when urinating, or frequent urination) *BOWEL PROBLEMS (unusual diarrhea, constipation, pain near the anus) TENDERNESS IN MOUTH AND THROAT WITH OR WITHOUT PRESENCE OF ULCERS (sore throat, sores in mouth, or a toothache) UNUSUAL RASH, SWELLING OR PAIN  UNUSUAL VAGINAL DISCHARGE OR ITCHING   Items with * indicate a potential emergency and should be followed up as soon as possible or go to the Emergency Department if any problems should occur.  Please show the CHEMOTHERAPY ALERT CARD or IMMUNOTHERAPY ALERT CARD at check-in to the  Emergency Department and triage nurse.  Should you have questions after your visit or need to cancel or reschedule your appointment, please contact [facility]  Dept: [phone]  and follow the prompts.  Office hours are 8:00 a.m. to 4:30 p.m. Monday - Friday. Please note that voicemails left after 4:00 p.m. may not be returned until the following business day.  We are closed weekends and major holidays. You have access to a nurse at all times for urgent questions. Please call the main number to the clinic Dept: [phone] and follow the prompts.   For any non-urgent questions, you may also contact your provider using MyChart. We now offer e-Visits for anyone 18 and older to request care online for non-urgent symptoms. For details visit [email or web address].   Also download the MyChart app! Go to the app store, search "MyChart", open the app, select [facility], and log in with your MyChart username and password.  Masks are optional in the cancer centers. If you would like for your care team to wear a mask while they are taking care of you, please let them know. You may have one support person who is at least 77 years old accompany you for your appointments. 

## 2022-02-01 NOTE — Progress Notes (Signed)
Ok to treat with creat 1.86 per Dr Julien Nordmann.

## 2022-02-15 ENCOUNTER — Inpatient Hospital Stay: Payer: Medicare Other

## 2022-02-15 ENCOUNTER — Inpatient Hospital Stay: Payer: Medicare Other | Attending: Oncology

## 2022-02-15 ENCOUNTER — Inpatient Hospital Stay: Payer: Medicare Other | Admitting: Oncology

## 2022-02-15 VITALS — BP 134/72 | HR 86 | Temp 98.5°F | Resp 16 | Wt 194.8 lb

## 2022-02-15 VITALS — BP 129/64 | HR 81 | Resp 17

## 2022-02-15 DIAGNOSIS — C679 Malignant neoplasm of bladder, unspecified: Secondary | ICD-10-CM

## 2022-02-15 DIAGNOSIS — C786 Secondary malignant neoplasm of retroperitoneum and peritoneum: Secondary | ICD-10-CM | POA: Diagnosis not present

## 2022-02-15 DIAGNOSIS — Z79899 Other long term (current) drug therapy: Secondary | ICD-10-CM | POA: Diagnosis not present

## 2022-02-15 DIAGNOSIS — Z5112 Encounter for antineoplastic immunotherapy: Secondary | ICD-10-CM | POA: Diagnosis not present

## 2022-02-15 LAB — CBC WITH DIFFERENTIAL (CANCER CENTER ONLY)
Abs Immature Granulocytes: 0.04 10*3/uL (ref 0.00–0.07)
Basophils Absolute: 0 10*3/uL (ref 0.0–0.1)
Basophils Relative: 1 %
Eosinophils Absolute: 0.2 10*3/uL (ref 0.0–0.5)
Eosinophils Relative: 5 %
HCT: 37.1 % (ref 36.0–46.0)
Hemoglobin: 12 g/dL (ref 12.0–15.0)
Immature Granulocytes: 1 %
Lymphocytes Relative: 20 %
Lymphs Abs: 0.8 10*3/uL (ref 0.7–4.0)
MCH: 30.3 pg (ref 26.0–34.0)
MCHC: 32.3 g/dL (ref 30.0–36.0)
MCV: 93.7 fL (ref 80.0–100.0)
Monocytes Absolute: 0.4 10*3/uL (ref 0.1–1.0)
Monocytes Relative: 9 %
Neutro Abs: 2.5 10*3/uL (ref 1.7–7.7)
Neutrophils Relative %: 64 %
Platelet Count: 161 10*3/uL (ref 150–400)
RBC: 3.96 MIL/uL (ref 3.87–5.11)
RDW: 13.6 % (ref 11.5–15.5)
WBC Count: 4 10*3/uL (ref 4.0–10.5)
nRBC: 0 % (ref 0.0–0.2)

## 2022-02-15 LAB — CMP (CANCER CENTER ONLY)
ALT: 14 U/L (ref 0–44)
AST: 27 U/L (ref 15–41)
Albumin: 3.9 g/dL (ref 3.5–5.0)
Alkaline Phosphatase: 77 U/L (ref 38–126)
Anion gap: 7 (ref 5–15)
BUN: 22 mg/dL (ref 8–23)
CO2: 23 mmol/L (ref 22–32)
Calcium: 9.1 mg/dL (ref 8.9–10.3)
Chloride: 111 mmol/L (ref 98–111)
Creatinine: 1.79 mg/dL — ABNORMAL HIGH (ref 0.44–1.00)
GFR, Estimated: 29 mL/min — ABNORMAL LOW (ref >60)
Glucose, Bld: 109 mg/dL — ABNORMAL HIGH (ref 70–99)
Potassium: 4.8 mmol/L (ref 3.5–5.1)
Sodium: 141 mmol/L (ref 135–145)
Total Bilirubin: 0.5 mg/dL (ref 0.3–1.2)
Total Protein: 6.8 g/dL (ref 6.5–8.1)

## 2022-02-15 MED ORDER — SODIUM CHLORIDE 0.9 % IV SOLN
1.0200 mg/kg | Freq: Once | INTRAVENOUS | Status: AC
  Administered 2022-02-15: 90 mg via INTRAVENOUS
  Filled 2022-02-15: qty 9

## 2022-02-15 MED ORDER — SODIUM CHLORIDE 0.9 % IV SOLN: Freq: Once | INTRAVENOUS | Status: AC

## 2022-02-15 MED ORDER — PROCHLORPERAZINE MALEATE 10 MG PO TABS
10.0000 mg | ORAL_TABLET | Freq: Once | ORAL | Status: AC
  Administered 2022-02-15: 10 mg via ORAL
  Filled 2022-02-15: qty 1

## 2022-02-15 NOTE — Progress Notes (Signed)
Hematology and Oncology Follow Up Visit  Alexandra Little [id number] 1946-01-10 76 y.o. 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  Prior Therapy:  She was treated with carboplatin and gemcitabine with the last infusion in March 2021.  She had severe cytopenias after 5 cycles of therapy and was switched to Avelumab in April 2021.    In July 2021 showed worsening of disease occluding retroperitoneal and inguinal adenopathy.  He subsequently was switched to [facility] in September 2021 therapy was poorly tolerated at day 1, 8 and 15 at 28-day cycle.  He subsequently was switched to 1 mg/kg day 1 day 15 out of 28-day cycle.    Last CT scan in February 2022 showed near complete response.   Padcev 1 mg/kg day 1 and day 15 of each cycle.  She received 1 cycle of therapy in March 2022 and discontinued on July 17, 2020 due to patient preference and achieving complete response.   Patient developed relapsed disease in October 2023.   Current therapy: Padcev 1 mg/kg day 1 and day 15 of each cycle.  She received day 1 of cycle 1 on February 01, 2022.  She is here for day 15 cycle one.   Interim History: Ms. Collett returns for follow up.  Since her last visit, she received the first day of Padcev treatment without any complications.  She denies any nausea, vomiting or abdominal pain.  She denies any worsening neuropathy or fatigue.  She denies any excessive tiredness.  Her respiratory status and upper respiratory tract infection has resolved.      Medications: Updated on review. Current Outpatient Medications  Medication Sig Dispense Refill   acetaminophen (TYLENOL) 500 MG tablet Take 1,000 mg by mouth every 6 (six) hours as needed for mild pain or headache.     albuterol (VENTOLIN HFA) 108 (90 Base) MCG/ACT inhaler Inhale 2 puffs into the  lungs every 4 (four) hours as needed for shortness of breath.     amoxicillin (AMOXIL) 500 MG tablet Take 1 tablet (500 mg total) by mouth 2 (two) times daily. 8 tablet 0   apixaban (ELIQUIS) 5 MG TABS tablet TAKE 1 TABLET(5 MG) BY MOUTH TWICE DAILY 60 tablet 3   diphenhydrAMINE (BENADRYL) 25 mg capsule Take 25 mg by mouth every 6 (six) hours as needed for itching.     famotidine-calcium carbonate-magnesium hydroxide (PEPCID COMPLETE) 10-800-165 MG chewable tablet Chew 1 tablet by mouth 2 (two) times daily as needed (heartburn/indigestion.).     guaiFENesin (MUCINEX) 600 MG 12 hr tablet Take 1 tablet (600 mg total) by mouth 2 (two) times daily. 30 tablet 1   hydrOXYzine (ATARAX) 10 MG tablet Take 1 tablet (10 mg total) by mouth every 6 (six) hours as needed. 60 tablet 3   Meth-Hyo-M Bl-Na Phos-Ph Sal (URIBEL) 118 MG CAPS Take 1 capsule (118 mg total) by mouth every 8 (eight) hours as needed. 30 capsule 5   MYRBETRIQ 50 MG TB24 tablet Take 50 mg by mouth daily.     predniSONE (DELTASONE) 20 MG tablet Take 1 tablet (20 mg total) by mouth daily with breakfast. Take three tabs for 4 days, then take two tabs for 4 days. Take one tab for 4 days. 30 tablet 1   No current facility-administered medications for this visit.     Allergies:  Allergies  Allergen Reactions   Lisinopril  Anaphylaxis   Chlorhexidine Itching   Fish Oil     Other reaction(s): tongue/lip swelling   Iodine Swelling    Patient states she either breaks out in hives or her tongue swells   Latex Itching   Salmon Oil [Nutritional Supplements] Swelling      Physical Exam:        Blood pressure 134/72, pulse 86, temperature 98.5 F (36.9 C), temperature source Oral, resp. rate 16, weight 194 lb 12.8 oz (88.4 kg), SpO2 97 %.       ECOG: 1    General appearance: Comfortable appearing without any discomfort Head: Normocephalic without any trauma Oropharynx: Mucous membranes are moist and pink without any thrush  or ulcers. Eyes: Pupils are equal and round reactive to light. Lymph nodes: No cervical, supraclavicular, inguinal or axillary lymphadenopathy.   Heart:regular rate and rhythm.  S1 and S2 without leg edema. Lung: Clear without any rhonchi or wheezes.  No dullness to percussion. Abdomin: Soft, nontender, nondistended with good bowel sounds.  No hepatosplenomegaly. Musculoskeletal: No joint deformity or effusion.  Full range of motion noted. Neurological: No deficits noted on motor, sensory and deep tendon reflex exam. Skin: No petechial rash or dryness.  Appeared moist.             Lab Results: Lab Results  Component Value Date   WBC 7.2 02/01/2022   HGB 12.5 02/01/2022   HCT 38.8 02/01/2022   MCV 94.9 02/01/2022   PLT 177 02/01/2022     Chemistry      Component Value Date/Time   NA 139 02/01/2022 1334   K 4.6 02/01/2022 1334   CL 110 02/01/2022 1334   CO2 24 02/01/2022 1334   BUN 26 (H) 02/01/2022 1334   CREATININE 1.86 (H) 02/01/2022 1334      Component Value Date/Time   CALCIUM 8.9 02/01/2022 1334   ALKPHOS 77 02/01/2022 1334   AST 15 02/01/2022 1334   ALT 9 02/01/2022 1334   BILITOT 0.5 02/01/2022 1334      Impression and Plan:   76 year old woman with:   1.  Stage IV high-grade urothelial carcinoma of the bladder with peritoneal involvement and adenopathy diagnosed in July 2021.  She is currently on Padcev after recent relapse and currently presenting for day 15 of cycle 1.  Complication associated with this treatment that include nausea, vomiting, neuropathy and hyperglycemia.  The plan is to update her staging scans after 3 months of therapy.  She is agreeable to proceed.   2.  IV access: Peripheral veins are currently in use and Port-A-Cath option was discussed and could be inserted in the future.   3.  Peripheral neuropathy: No recent exacerbation noted.  We will continue to monitor on Padcev.   4.  Hydronephrosis: Stents are currently in place  and changed periodically by Dr. Alinda Money.   5.  Follow-up: She will return in 2 weeks for the start of cycle 2.   30  minutes were spent on this encounter.  Time was dedicated to reviewing laboratory data, disease status update and outlining future plan of care discussion.   Zola Button, MD 11/6/20238:04 AM

## 2022-02-16 ENCOUNTER — Other Ambulatory Visit: Payer: Self-pay

## 2022-02-21 ENCOUNTER — Other Ambulatory Visit: Payer: Self-pay

## 2022-03-01 ENCOUNTER — Other Ambulatory Visit: Payer: Medicare Other

## 2022-03-01 ENCOUNTER — Inpatient Hospital Stay: Payer: Medicare Other | Admitting: Oncology

## 2022-03-01 ENCOUNTER — Inpatient Hospital Stay: Payer: Medicare Other

## 2022-03-01 ENCOUNTER — Ambulatory Visit: Payer: Medicare Other

## 2022-03-01 DIAGNOSIS — C679 Malignant neoplasm of bladder, unspecified: Secondary | ICD-10-CM

## 2022-03-01 DIAGNOSIS — Z5112 Encounter for antineoplastic immunotherapy: Secondary | ICD-10-CM | POA: Diagnosis not present

## 2022-03-01 DIAGNOSIS — C786 Secondary malignant neoplasm of retroperitoneum and peritoneum: Secondary | ICD-10-CM | POA: Diagnosis not present

## 2022-03-01 DIAGNOSIS — Z79899 Other long term (current) drug therapy: Secondary | ICD-10-CM | POA: Diagnosis not present

## 2022-03-01 LAB — CBC WITH DIFFERENTIAL (CANCER CENTER ONLY)
Abs Immature Granulocytes: 0.05 10*3/uL (ref 0.00–0.07)
Basophils Absolute: 0.1 10*3/uL (ref 0.0–0.1)
Basophils Relative: 1 %
Eosinophils Absolute: 0.1 10*3/uL (ref 0.0–0.5)
Eosinophils Relative: 3 %
HCT: 36.6 % (ref 36.0–46.0)
Hemoglobin: 11.9 g/dL — ABNORMAL LOW (ref 12.0–15.0)
Immature Granulocytes: 1 %
Lymphocytes Relative: 18 %
Lymphs Abs: 1 10*3/uL (ref 0.7–4.0)
MCH: 30.5 pg (ref 26.0–34.0)
MCHC: 32.5 g/dL (ref 30.0–36.0)
MCV: 93.8 fL (ref 80.0–100.0)
Monocytes Absolute: 0.5 10*3/uL (ref 0.1–1.0)
Monocytes Relative: 9 %
Neutro Abs: 3.7 10*3/uL (ref 1.7–7.7)
Neutrophils Relative %: 68 %
Platelet Count: 168 10*3/uL (ref 150–400)
RBC: 3.9 MIL/uL (ref 3.87–5.11)
RDW: 14.6 % (ref 11.5–15.5)
WBC Count: 5.4 10*3/uL (ref 4.0–10.5)
nRBC: 0 % (ref 0.0–0.2)

## 2022-03-01 LAB — CMP (CANCER CENTER ONLY)
ALT: 15 U/L (ref 0–44)
AST: 32 U/L (ref 15–41)
Albumin: 4 g/dL (ref 3.5–5.0)
Alkaline Phosphatase: 67 U/L (ref 38–126)
Anion gap: 8 (ref 5–15)
BUN: 23 mg/dL (ref 8–23)
CO2: 23 mmol/L (ref 22–32)
Calcium: 9.2 mg/dL (ref 8.9–10.3)
Chloride: 111 mmol/L (ref 98–111)
Creatinine: 1.93 mg/dL — ABNORMAL HIGH (ref 0.44–1.00)
GFR, Estimated: 26 mL/min — ABNORMAL LOW (ref >60)
Glucose, Bld: 110 mg/dL — ABNORMAL HIGH (ref 70–99)
Potassium: 4.3 mmol/L (ref 3.5–5.1)
Sodium: 142 mmol/L (ref 135–145)
Total Bilirubin: 0.4 mg/dL (ref 0.3–1.2)
Total Protein: 6.9 g/dL (ref 6.5–8.1)

## 2022-03-01 MED ORDER — PROCHLORPERAZINE MALEATE 10 MG PO TABS
10.0000 mg | ORAL_TABLET | Freq: Once | ORAL | Status: AC
  Administered 2022-03-01: 10 mg via ORAL
  Filled 2022-03-01: qty 1

## 2022-03-01 MED ORDER — SODIUM CHLORIDE 0.9 % IV SOLN
1.0200 mg/kg | Freq: Once | INTRAVENOUS | Status: AC
  Administered 2022-03-01: 90 mg via INTRAVENOUS
  Filled 2022-03-01: qty 9

## 2022-03-01 MED ORDER — SODIUM CHLORIDE 0.9 % IV SOLN: Freq: Once | INTRAVENOUS | Status: DC

## 2022-03-01 NOTE — Progress Notes (Signed)
Hematology and Oncology Follow Up Visit  Alexandra Little [id number] Mar 05, 1946 76 y.o. 03/01/2022 8:14 AM [name], MDShadad, [name], MD   Principle Diagnosis: 61 year old woman with bladder cancer diagnosed in 2021.  She was found to have stage IV high-grade urothelial carcinoma, FGFR positive mutation and pelvic adenopathy.  Prior Therapy:  She was treated with carboplatin and gemcitabine with the last infusion in March 2021.  She had severe cytopenias after 5 cycles of therapy and was switched to Avelumab in April 2021.    In July 2021 showed worsening of disease occluding retroperitoneal and inguinal adenopathy.  He subsequently was switched to [facility] in September 2021 therapy was poorly tolerated at day 1, 8 and 15 at 28-day cycle.  He subsequently was switched to 1 mg/kg day 1 day 15 out of 28-day cycle.    Last CT scan in February 2022 showed near complete response.   Padcev 1 mg/kg day 1 and day 15 of each cycle.  She received 1 cycle of therapy in March 2022 and discontinued on July 17, 2020 due to patient preference and achieving complete response.   Patient developed relapsed disease in October 2023.   Current therapy: Padcev 1 mg/kg day 1 and day 15 of each cycle.  She received day 1 of cycle 1 on February 01, 2022.  He is here for day 1 cycle 2 of therapy.  Interim History: Ms. Shorb presents today for a follow-up evaluation.  Since the last visit, she reports no major changes in her health.  She denies any nausea, vomiting or abdominal pain.  She denies any other complications related to Padcev.  She denies any worsening neuropathy or fatigue.  She denies any hospitalizations or illnesses.      Medications: Reviewed without changes. Current Outpatient Medications  Medication Sig Dispense Refill   acetaminophen (TYLENOL) 500 MG tablet Take 1,000 mg by mouth every 6 (six) hours as needed for mild pain or headache.     albuterol (VENTOLIN HFA) 108 (90 Base) MCG/ACT  inhaler Inhale 2 puffs into the lungs every 4 (four) hours as needed for shortness of breath.     amoxicillin (AMOXIL) 500 MG tablet Take 1 tablet (500 mg total) by mouth 2 (two) times daily. 8 tablet 0   apixaban (ELIQUIS) 5 MG TABS tablet TAKE 1 TABLET(5 MG) BY MOUTH TWICE DAILY 60 tablet 3   diphenhydrAMINE (BENADRYL) 25 mg capsule Take 25 mg by mouth every 6 (six) hours as needed for itching.     famotidine-calcium carbonate-magnesium hydroxide (PEPCID COMPLETE) 10-800-165 MG chewable tablet Chew 1 tablet by mouth 2 (two) times daily as needed (heartburn/indigestion.).     fluticasone-salmeterol (ADVAIR) 250-50 MCG/ACT AEPB Inhale 1 puff into the lungs in the morning and at bedtime.     guaiFENesin (MUCINEX) 600 MG 12 hr tablet Take 1 tablet (600 mg total) by mouth 2 (two) times daily. 30 tablet 1   hydrOXYzine (ATARAX) 10 MG tablet Take 1 tablet (10 mg total) by mouth every 6 (six) hours as needed. 60 tablet 3   Meth-Hyo-M Bl-Na Phos-Ph Sal (URIBEL) 118 MG CAPS Take 1 capsule (118 mg total) by mouth every 8 (eight) hours as needed. 30 capsule 5   MYRBETRIQ 50 MG TB24 tablet Take 50 mg by mouth daily.     predniSONE (DELTASONE) 20 MG tablet Take 1 tablet (20 mg total) by mouth daily with breakfast. Take three tabs for 4 days, then take two tabs for 4 days. Take one tab for  4 days. 30 tablet 1   No current facility-administered medications for this visit.     Allergies:  Allergies  Allergen Reactions   Lisinopril Anaphylaxis   Chlorhexidine Itching   Fish Oil     Other reaction(s): tongue/lip swelling   Iodine Swelling    Patient states she either breaks out in hives or her tongue swells   Latex Itching   Salmon Oil [Nutritional Supplements] Swelling      Physical Exam:        Blood pressure 133/61, pulse 92, temperature 98.3 F (36.8 C), resp. rate 16, weight 195 lb 11.2 oz (88.8 kg), SpO2 98 %.        ECOG: 1   General appearance: Alert, awake without any  distress. Head: Atraumatic without abnormalities Oropharynx: Without any thrush or ulcers. Eyes: No scleral icterus. Lymph nodes: No lymphadenopathy noted in the cervical, supraclavicular, or axillary nodes Heart:regular rate and rhythm, without any murmurs or gallops.   Lung: Clear to auscultation without any rhonchi, wheezes or dullness to percussion. Abdomin: Soft, nontender without any shifting dullness or ascites. Musculoskeletal: No clubbing or cyanosis. Neurological: No motor or sensory deficits. Skin: No rashes or lesions.            Lab Results: Lab Results  Component Value Date   WBC 5.4 03/01/2022   HGB 11.9 (L) 03/01/2022   HCT 36.6 03/01/2022   MCV 93.8 03/01/2022   PLT 168 03/01/2022     Chemistry      Component Value Date/Time   NA 141 02/15/2022 0812   K 4.8 02/15/2022 0812   CL 111 02/15/2022 0812   CO2 23 02/15/2022 0812   BUN 22 02/15/2022 0812   CREATININE 1.79 (H) 02/15/2022 0812      Component Value Date/Time   CALCIUM 9.1 02/15/2022 0812   ALKPHOS 77 02/15/2022 0812   AST 27 02/15/2022 0812   ALT 14 02/15/2022 0812   BILITOT 0.5 02/15/2022 0812      Impression and Plan:   76 year old woman with:   1.  Bladder cancer diagnosed in 2021.  She was found to have stage IV high-grade urothelial carcinoma with peritoneal involvement and adenopathy.   Risks and benefits of continuing Padcev were discussed at this time.  FGFR inhibitors would be utilized as a different salvage therapy option if she does not have any improvement after imaging studies.  I plan to update her staging scans after cycle 3 conclusion.  She is agreeable with this plan.   2.  IV access: No complication related to her peripheral veins.  Port-A-Cath option has been deferred   3.  Peripheral neuropathy: Related to previous chemotherapy exposure without any recent exacerbation.   4.  Hydronephrosis: Related to her history of bladder cancer.  She is currently managed by  Dr. Alinda Money and urology.   5.  Follow-up: In 2 weeks to complete the current cycle and in 4 weeks for MD follow-up.   30  minutes were spent on this visit.  The time was dedicated to updating disease status, treatment choices and outlining future plan of care review.  Zola Button, MD 11/20/20238:14 AM

## 2022-03-11 ENCOUNTER — Other Ambulatory Visit: Payer: Self-pay

## 2022-03-15 ENCOUNTER — Ambulatory Visit: Payer: Medicare Other

## 2022-03-15 ENCOUNTER — Other Ambulatory Visit: Payer: Medicare Other

## 2022-03-17 ENCOUNTER — Other Ambulatory Visit: Payer: Medicare Other

## 2022-03-22 ENCOUNTER — Inpatient Hospital Stay: Payer: Medicare Other | Attending: Oncology

## 2022-03-22 ENCOUNTER — Inpatient Hospital Stay (HOSPITAL_BASED_OUTPATIENT_CLINIC_OR_DEPARTMENT_OTHER): Payer: Medicare Other | Admitting: Oncology

## 2022-03-22 ENCOUNTER — Inpatient Hospital Stay: Payer: Medicare Other

## 2022-03-22 VITALS — BP 147/64 | HR 90 | Temp 98.1°F | Resp 16 | Wt 197.8 lb

## 2022-03-22 VITALS — BP 121/57 | HR 68 | Temp 98.6°F | Resp 16

## 2022-03-22 DIAGNOSIS — Z79899 Other long term (current) drug therapy: Secondary | ICD-10-CM | POA: Insufficient documentation

## 2022-03-22 DIAGNOSIS — C786 Secondary malignant neoplasm of retroperitoneum and peritoneum: Secondary | ICD-10-CM | POA: Diagnosis not present

## 2022-03-22 DIAGNOSIS — Z5112 Encounter for antineoplastic immunotherapy: Secondary | ICD-10-CM | POA: Insufficient documentation

## 2022-03-22 DIAGNOSIS — C679 Malignant neoplasm of bladder, unspecified: Secondary | ICD-10-CM

## 2022-03-22 LAB — CMP (CANCER CENTER ONLY)
ALT: 9 U/L (ref 0–44)
AST: 21 U/L (ref 15–41)
Albumin: 4.1 g/dL (ref 3.5–5.0)
Alkaline Phosphatase: 77 U/L (ref 38–126)
Anion gap: 7 (ref 5–15)
BUN: 27 mg/dL — ABNORMAL HIGH (ref 8–23)
CO2: 22 mmol/L (ref 22–32)
Calcium: 9.5 mg/dL (ref 8.9–10.3)
Chloride: 112 mmol/L — ABNORMAL HIGH (ref 98–111)
Creatinine: 1.83 mg/dL — ABNORMAL HIGH (ref 0.44–1.00)
GFR, Estimated: 28 mL/min — ABNORMAL LOW (ref >60)
Glucose, Bld: 104 mg/dL — ABNORMAL HIGH (ref 70–99)
Potassium: 4.4 mmol/L (ref 3.5–5.1)
Sodium: 141 mmol/L (ref 135–145)
Total Bilirubin: 0.5 mg/dL (ref 0.3–1.2)
Total Protein: 7 g/dL (ref 6.5–8.1)

## 2022-03-22 LAB — CBC WITH DIFFERENTIAL (CANCER CENTER ONLY)
Abs Immature Granulocytes: 0.03 10*3/uL (ref 0.00–0.07)
Basophils Absolute: 0 10*3/uL (ref 0.0–0.1)
Basophils Relative: 1 %
Eosinophils Absolute: 0.4 10*3/uL (ref 0.0–0.5)
Eosinophils Relative: 9 %
HCT: 36.3 % (ref 36.0–46.0)
Hemoglobin: 11.7 g/dL — ABNORMAL LOW (ref 12.0–15.0)
Immature Granulocytes: 1 %
Lymphocytes Relative: 15 %
Lymphs Abs: 0.7 10*3/uL (ref 0.7–4.0)
MCH: 29.8 pg (ref 26.0–34.0)
MCHC: 32.2 g/dL (ref 30.0–36.0)
MCV: 92.4 fL (ref 80.0–100.0)
Monocytes Absolute: 0.6 10*3/uL (ref 0.1–1.0)
Monocytes Relative: 12 %
Neutro Abs: 3.2 10*3/uL (ref 1.7–7.7)
Neutrophils Relative %: 62 %
Platelet Count: 181 10*3/uL (ref 150–400)
RBC: 3.93 MIL/uL (ref 3.87–5.11)
RDW: 14.7 % (ref 11.5–15.5)
WBC Count: 5 10*3/uL (ref 4.0–10.5)
nRBC: 0 % (ref 0.0–0.2)

## 2022-03-22 MED ORDER — PROCHLORPERAZINE MALEATE 10 MG PO TABS
10.0000 mg | ORAL_TABLET | Freq: Once | ORAL | Status: AC
  Administered 2022-03-22: 10 mg via ORAL
  Filled 2022-03-22: qty 1

## 2022-03-22 MED ORDER — SODIUM CHLORIDE 0.9 % IV SOLN: Freq: Once | INTRAVENOUS | Status: AC

## 2022-03-22 MED ORDER — SODIUM CHLORIDE 0.9 % IV SOLN
1.0200 mg/kg | Freq: Once | INTRAVENOUS | Status: AC
  Administered 2022-03-22: 90 mg via INTRAVENOUS
  Filled 2022-03-22: qty 9

## 2022-03-22 NOTE — Progress Notes (Signed)
Patient saw Dr. Alen Blew today and discussed some possible new neuropathy in her hands- this RN specifically re-addressed it with Dr. Alen Blew and it is ok to treat today.

## 2022-03-22 NOTE — Patient Instructions (Signed)
[facility] ONCOLOG[facility]  Discharge Instructions: Thank you for choosing [facility] to provide your oncology and hematology care.   If you have a lab appointment with the [facility], please go directly to the [facility] and check in at the registration area.   Wear comfortable clothing and clothing appropriate for easy access to any Portacath or PICC line.   We strive to give you quality time with your provider. You may need to reschedule your appointment if you arrive late (15 or more minutes).  Arriving late affects you and other patients whose appointments are after yours.  Also, if you miss three or more appointments without notifying the office, you may be dismissed from the clinic at the provider's discretion.      For prescription refill requests, have your pharmacy contact our office and allow 72 hours for refills to be completed.    Today you received the following chemotherapy and/or immunotherapy agents: Padcev      To help prevent nausea and vomiting after your treatment, we encourage you to take your nausea medication as directed.  BELOW ARE SYMPTOMS THAT SHOULD BE REPORTED IMMEDIATELY: *FEVER GREATER THAN 100.4 F (38 C) OR HIGHER *CHILLS OR SWEATING *NAUSEA AND VOMITING THAT IS NOT CONTROLLED WITH YOUR NAUSEA MEDICATION *UNUSUAL SHORTNESS OF BREATH *UNUSUAL BRUISING OR BLEEDING *URINARY PROBLEMS (pain or burning when urinating, or frequent urination) *BOWEL PROBLEMS (unusual diarrhea, constipation, pain near the anus) TENDERNESS IN MOUTH AND THROAT WITH OR WITHOUT PRESENCE OF ULCERS (sore throat, sores in mouth, or a toothache) UNUSUAL RASH, SWELLING OR PAIN  UNUSUAL VAGINAL DISCHARGE OR ITCHING   Items with * indicate a potential emergency and should be followed up as soon as possible or go to the Emergency Department if any problems should occur.  Please show the CHEMOTHERAPY ALERT CARD or IMMUNOTHERAPY ALERT CARD at check-in to the  Emergency Department and triage nurse.  Should you have questions after your visit or need to cancel or reschedule your appointment, please contact [facility]  Dept: [phone]  and follow the prompts.  Office hours are 8:00 a.m. to 4:30 p.m. Monday - Friday. Please note that voicemails left after 4:00 p.m. may not be returned until the following business day.  We are closed weekends and major holidays. You have access to a nurse at all times for urgent questions. Please call the main number to the clinic Dept: [phone] and follow the prompts.   For any non-urgent questions, you may also contact your provider using MyChart. We now offer e-Visits for anyone 23 and older to request care online for non-urgent symptoms. For details visit [email or web address].   Also download the MyChart app! Go to the app store, search "MyChart", open the app, select [facility], and log in with your MyChart username and password.  Masks are optional in the cancer centers. If you would like for your care team to wear a mask while they are taking care of you, please let them know. You may have one support person who is at least 77 years old accompany you for your appointments.

## 2022-03-22 NOTE — Progress Notes (Signed)
Hematology and Oncology Follow Up Visit  Alexandra Little [id number] 10/17/1945 76 y.o. 03/22/2022 8:06 AM Alexandra Little, MDShadad, Alexandra Dad, MD   Principle Diagnosis: 56 year old woman with stage IV high-grade urothelial carcinoma of the bladder diagnosed in March 2021.  She was found to have FGFR positive mutation and pelvic adenopathy.  Prior Therapy:  She was treated with carboplatin and gemcitabine with the last infusion in March 2021.  She had severe cytopenias after 5 cycles of therapy and was switched to Avelumab in April 2021.    In July 2021 showed worsening of disease occluding retroperitoneal and inguinal adenopathy.  He subsequently was switched to [facility] in September 2021 therapy was poorly tolerated at day 1, 8 and 15 at 28-day cycle.  He subsequently was switched to 1 mg/kg day 1 day 15 out of 28-day cycle.    Last CT scan in February 2022 showed near complete response.   Padcev 1 mg/kg day 1 and day 15 of each cycle.  She received 1 cycle of therapy in March 2022 and discontinued on July 17, 2020 due to patient preference and achieving complete response.   Patient developed relapsed disease in October 2023.   Current therapy: Padcev 1 mg/kg day 1 and day 15 of each cycle.  She received day 1 of cycle 1 on February 01, 2022.  She is here for day 15 cycle 3 of therapy.  Interim History: Alexandra Little returns today for repeat follow-up.  Since the last visit, she reports no major changes in her health.  She did report some cramping associated with her hands and has resolved at this time.  Cramping occurred while she was improved and she needed to take Tylenol.  She denies any nausea vomiting or worsening neuropathy.  She denies any hospitalizations or illnesses.      Medications: Updated on review. Current Outpatient Medications  Medication Sig Dispense Refill   acetaminophen (TYLENOL) 500 MG tablet Take 1,000 mg by mouth every 6 (six) hours as needed for mild pain or headache.      albuterol (VENTOLIN HFA) 108 (90 Base) MCG/ACT inhaler Inhale 2 puffs into the lungs every 4 (four) hours as needed for shortness of breath.     amoxicillin (AMOXIL) 500 MG tablet Take 1 tablet (500 mg total) by mouth 2 (two) times daily. 8 tablet 0   apixaban (ELIQUIS) 5 MG TABS tablet TAKE 1 TABLET(5 MG) BY MOUTH TWICE DAILY 60 tablet 3   diphenhydrAMINE (BENADRYL) 25 mg capsule Take 25 mg by mouth every 6 (six) hours as needed for itching.     famotidine-calcium carbonate-magnesium hydroxide (PEPCID COMPLETE) 10-800-165 MG chewable tablet Chew 1 tablet by mouth 2 (two) times daily as needed (heartburn/indigestion.).     fluticasone-salmeterol (ADVAIR) 250-50 MCG/ACT AEPB Inhale 1 puff into the lungs in the morning and at bedtime.     guaiFENesin (MUCINEX) 600 MG 12 hr tablet Take 1 tablet (600 mg total) by mouth 2 (two) times daily. 30 tablet 1   hydrOXYzine (ATARAX) 10 MG tablet Take 1 tablet (10 mg total) by mouth every 6 (six) hours as needed. 60 tablet 3   Meth-Hyo-M Bl-Na Phos-Ph Sal (URIBEL) 118 MG CAPS Take 1 capsule (118 mg total) by mouth every 8 (eight) hours as needed. 30 capsule 5   MYRBETRIQ 50 MG TB24 tablet Take 50 mg by mouth daily.     predniSONE (DELTASONE) 20 MG tablet Take 1 tablet (20 mg total) by mouth daily with breakfast. Take three tabs for  4 days, then take two tabs for 4 days. Take one tab for 4 days. 30 tablet 1   No current facility-administered medications for this visit.     Allergies:  Allergies  Allergen Reactions   Lisinopril Anaphylaxis   Chlorhexidine Itching   Fish Oil     Other reaction(s): tongue/lip swelling   Iodine Swelling    Patient states she either breaks out in hives or her tongue swells   Latex Itching   Salmon Oil [Nutritional Supplements] Swelling      Physical Exam:          Blood pressure (!) 147/64, pulse 90, temperature 98.1 F (36.7 C), temperature source Temporal, resp. rate 16, weight 197 lb 12.8 oz (89.7 kg),  SpO2 97 %.       ECOG: 1   General appearance: Comfortable appearing without any discomfort Head: Normocephalic without any trauma Oropharynx: Mucous membranes are moist and pink without any thrush or ulcers. Eyes: Pupils are equal and round reactive to light. Lymph nodes: No cervical, supraclavicular, inguinal or axillary lymphadenopathy.   Heart:regular rate and rhythm.  S1 and S2 without leg edema. Lung: Clear without any rhonchi or wheezes.  No dullness to percussion. Abdomin: Soft, nontender, nondistended with good bowel sounds.  No hepatosplenomegaly. Musculoskeletal: No joint deformity or effusion.  Full range of motion noted. Neurological: No deficits noted on motor, sensory and deep tendon reflex exam. Skin: No petechial rash or dryness.  Appeared moist.              Lab Results: Lab Results  Component Value Date   WBC 5.4 03/01/2022   HGB 11.9 (L) 03/01/2022   HCT 36.6 03/01/2022   MCV 93.8 03/01/2022   PLT 168 03/01/2022     Chemistry      Component Value Date/Time   NA 142 03/01/2022 0802   K 4.3 03/01/2022 0802   CL 111 03/01/2022 0802   CO2 23 03/01/2022 0802   BUN 23 03/01/2022 0802   CREATININE 1.93 (H) 03/01/2022 0802      Component Value Date/Time   CALCIUM 9.2 03/01/2022 0802   ALKPHOS 67 03/01/2022 0802   AST 32 03/01/2022 0802   ALT 15 03/01/2022 0802   BILITOT 0.4 03/01/2022 0802      Impression and Plan:   76 year old woman with:   1.  Stage IV high-grade urothelial carcinoma of the bladder with peritoneal involvement and adenopathy diagnosed in 2021 with relapsed disease in 2023.  She continues to be on Padcev without any major complications.  Risks and benefits of continuing this treatment versus switching to FGFR inhibitor were reviewed.  These complications include nausea, fatigue, mild suppression, neuropathy, hypoglycemia among others.  After discussion today she is agreeable to proceed with treatment at this time and  I will update her imaging studies before the January appointment.   2.  IV access: Port-A-Cath option has been deferred at this time.   3.  Peripheral neuropathy: Will continue to monitor on Padcev which has not worsened at this time.   4.  Hydronephrosis: She continues to follow with Dr. Alinda Money regarding this issue with stent exchange periodically.   5.  Follow-up: She will continue to receive chemotherapy every 2 weeks.  MD follow-up in 4 weeks.   30  minutes were spent on this encounter.  The time was dedicated to updating disease status, treatment choices and outlining complications related to cancer and cancer therapy.  Zola Button, MD 12/11/20238:06 AM

## 2022-03-24 ENCOUNTER — Ambulatory Visit: Payer: Medicare Other | Admitting: Oncology

## 2022-03-28 ENCOUNTER — Other Ambulatory Visit: Payer: Self-pay

## 2022-03-30 ENCOUNTER — Other Ambulatory Visit: Payer: Self-pay

## 2022-04-06 ENCOUNTER — Inpatient Hospital Stay: Payer: Medicare Other

## 2022-04-06 VITALS — BP 133/61 | HR 70 | Temp 98.1°F | Resp 16

## 2022-04-06 DIAGNOSIS — C679 Malignant neoplasm of bladder, unspecified: Secondary | ICD-10-CM

## 2022-04-06 DIAGNOSIS — Z5112 Encounter for antineoplastic immunotherapy: Secondary | ICD-10-CM | POA: Diagnosis not present

## 2022-04-06 DIAGNOSIS — Z79899 Other long term (current) drug therapy: Secondary | ICD-10-CM | POA: Diagnosis not present

## 2022-04-06 DIAGNOSIS — C786 Secondary malignant neoplasm of retroperitoneum and peritoneum: Secondary | ICD-10-CM | POA: Diagnosis not present

## 2022-04-06 LAB — CBC WITH DIFFERENTIAL (CANCER CENTER ONLY)
Abs Immature Granulocytes: 0.03 10*3/uL (ref 0.00–0.07)
Basophils Absolute: 0.1 10*3/uL (ref 0.0–0.1)
Basophils Relative: 1 %
Eosinophils Absolute: 0.4 10*3/uL (ref 0.0–0.5)
Eosinophils Relative: 6 %
HCT: 40.8 % (ref 36.0–46.0)
Hemoglobin: 13.3 g/dL (ref 12.0–15.0)
Immature Granulocytes: 0 %
Lymphocytes Relative: 16 %
Lymphs Abs: 1.1 10*3/uL (ref 0.7–4.0)
MCH: 29.6 pg (ref 26.0–34.0)
MCHC: 32.6 g/dL (ref 30.0–36.0)
MCV: 90.7 fL (ref 80.0–100.0)
Monocytes Absolute: 0.6 10*3/uL (ref 0.1–1.0)
Monocytes Relative: 8 %
Neutro Abs: 4.9 10*3/uL (ref 1.7–7.7)
Neutrophils Relative %: 69 %
Platelet Count: 209 10*3/uL (ref 150–400)
RBC: 4.5 MIL/uL (ref 3.87–5.11)
RDW: 14.6 % (ref 11.5–15.5)
WBC Count: 7.1 10*3/uL (ref 4.0–10.5)
nRBC: 0 % (ref 0.0–0.2)

## 2022-04-06 LAB — CMP (CANCER CENTER ONLY)
ALT: 11 U/L (ref 0–44)
AST: 22 U/L (ref 15–41)
Albumin: 4.3 g/dL (ref 3.5–5.0)
Alkaline Phosphatase: 80 U/L (ref 38–126)
Anion gap: 8 (ref 5–15)
BUN: 26 mg/dL — ABNORMAL HIGH (ref 8–23)
CO2: 23 mmol/L (ref 22–32)
Calcium: 9.7 mg/dL (ref 8.9–10.3)
Chloride: 110 mmol/L (ref 98–111)
Creatinine: 2.04 mg/dL — ABNORMAL HIGH (ref 0.44–1.00)
GFR, Estimated: 25 mL/min — ABNORMAL LOW (ref >60)
Glucose, Bld: 126 mg/dL — ABNORMAL HIGH (ref 70–99)
Potassium: 4.6 mmol/L (ref 3.5–5.1)
Sodium: 141 mmol/L (ref 135–145)
Total Bilirubin: 0.4 mg/dL (ref 0.3–1.2)
Total Protein: 7.6 g/dL (ref 6.5–8.1)

## 2022-04-06 MED ORDER — SODIUM CHLORIDE 0.9 % IV SOLN
1.0200 mg/kg | Freq: Once | INTRAVENOUS | Status: DC
  Filled 2022-04-06: qty 9

## 2022-04-06 MED ORDER — SODIUM CHLORIDE 0.9 % IV SOLN
80.0000 mg | Freq: Once | INTRAVENOUS | Status: AC
  Administered 2022-04-06: 80 mg via INTRAVENOUS
  Filled 2022-04-06: qty 8

## 2022-04-06 MED ORDER — PROCHLORPERAZINE MALEATE 10 MG PO TABS
10.0000 mg | ORAL_TABLET | Freq: Once | ORAL | Status: AC
  Administered 2022-04-06: 10 mg via ORAL
  Filled 2022-04-06: qty 1

## 2022-04-06 MED ORDER — SODIUM CHLORIDE 0.9 % IV SOLN: Freq: Once | INTRAVENOUS | Status: AC

## 2022-04-06 NOTE — Progress Notes (Signed)
Ok to give Padcev '80mg'$  today instead of '90mg'$  d/t shortage of drug on site per Dr Lorenso Courier

## 2022-04-06 NOTE — Patient Instructions (Signed)
[facility]  Discharge Instructions: Thank you for choosing [facility] to provide your oncology and hematology care.   If you have a lab appointment with the [facility], please go directly to the [facility] and check in at the registration area.   Wear comfortable clothing and clothing appropriate for easy access to any Portacath or PICC line.   We strive to give you quality time with your provider. You may need to reschedule your appointment if you arrive late (15 or more minutes).  Arriving late affects you and other patients whose appointments are after yours.  Also, if you miss three or more appointments without notifying the office, you may be dismissed from the clinic at the provider's discretion.      For prescription refill requests, have your pharmacy contact our office and allow 72 hours for refills to be completed.    Today you received the following chemotherapy and/or immunotherapy agents padcev      To help prevent nausea and vomiting after your treatment, we encourage you to take your nausea medication as directed.  BELOW ARE SYMPTOMS THAT SHOULD BE REPORTED IMMEDIATELY: *FEVER GREATER THAN 100.4 F (38 C) OR HIGHER *CHILLS OR SWEATING *NAUSEA AND VOMITING THAT IS NOT CONTROLLED WITH YOUR NAUSEA MEDICATION *UNUSUAL SHORTNESS OF BREATH *UNUSUAL BRUISING OR BLEEDING *URINARY PROBLEMS (pain or burning when urinating, or frequent urination) *BOWEL PROBLEMS (unusual diarrhea, constipation, pain near the anus) TENDERNESS IN MOUTH AND THROAT WITH OR WITHOUT PRESENCE OF ULCERS (sore throat, sores in mouth, or a toothache) UNUSUAL RASH, SWELLING OR PAIN  UNUSUAL VAGINAL DISCHARGE OR ITCHING   Items with * indicate a potential emergency and should be followed up as soon as possible or go to the Emergency Department if any problems should occur.  Please show the CHEMOTHERAPY ALERT CARD or IMMUNOTHERAPY ALERT CARD at check-in to the  Emergency Department and triage nurse.  Should you have questions after your visit or need to cancel or reschedule your appointment, please contact [facility]  Dept: [phone]  and follow the prompts.  Office hours are 8:00 a.m. to 4:30 p.m. Monday - Friday. Please note that voicemails left after 4:00 p.m. may not be returned until the following business day.  We are closed weekends and major holidays. You have access to a nurse at all times for urgent questions. Please call the main number to the clinic Dept: [phone] and follow the prompts.   For any non-urgent questions, you may also contact your provider using MyChart. We now offer e-Visits for anyone 38 and older to request care online for non-urgent symptoms. For details visit [email or web address].   Also download the MyChart app! Go to the app store, search "MyChart", open the app, select [facility], and log in with your MyChart username and password.  Masks are optional in the cancer centers. If you would like for your care team to wear a mask while they are taking care of you, please let them know. You may have one support person who is at least 77 years old accompany you for your appointments.

## 2022-04-14 ENCOUNTER — Telehealth: Payer: Self-pay | Admitting: Oncology

## 2022-04-14 NOTE — Telephone Encounter (Signed)
Contacted patient to scheduled appointments. Patient is aware of appointments that are scheduled.   

## 2022-04-14 NOTE — Telephone Encounter (Signed)
Called patient regarding upcoming January appointments, left a voicemail with patient's spouse number. I was unable to reach patient patient's number.

## 2022-04-16 ENCOUNTER — Encounter (HOSPITAL_COMMUNITY)
Admission: RE | Admit: 2022-04-16 | Discharge: 2022-04-16 | Disposition: A | Discharge status: Home / Self Care | Payer: Medicare Other | Source: Ambulatory Visit | Attending: Oncology | Admitting: Oncology

## 2022-04-16 DIAGNOSIS — C679 Malignant neoplasm of bladder, unspecified: Secondary | ICD-10-CM | POA: Diagnosis not present

## 2022-04-16 LAB — GLUCOSE, CAPILLARY: Glucose-Capillary: 98 mg/dL (ref 70–99)

## 2022-04-16 MED ORDER — FLUDEOXYGLUCOSE F - 18 (FDG) INJECTION
10.0000 | Freq: Once | INTRAVENOUS | Status: AC
  Administered 2022-04-16: 9.36 via INTRAVENOUS

## 2022-04-20 ENCOUNTER — Inpatient Hospital Stay: Payer: Medicare Other

## 2022-04-20 ENCOUNTER — Inpatient Hospital Stay: Payer: Medicare Other | Attending: Oncology | Admitting: Oncology

## 2022-04-20 ENCOUNTER — Ambulatory Visit: Payer: Medicare Other

## 2022-04-20 VITALS — BP 141/74 | HR 90 | Temp 97.8°F | Resp 18 | Ht 64.0 in | Wt 190.4 lb

## 2022-04-20 DIAGNOSIS — T451X5A Adverse effect of antineoplastic and immunosuppressive drugs, initial encounter: Secondary | ICD-10-CM | POA: Insufficient documentation

## 2022-04-20 DIAGNOSIS — Z79899 Other long term (current) drug therapy: Secondary | ICD-10-CM | POA: Diagnosis not present

## 2022-04-20 DIAGNOSIS — N1339 Other hydronephrosis: Secondary | ICD-10-CM | POA: Insufficient documentation

## 2022-04-20 DIAGNOSIS — G620 Drug-induced polyneuropathy: Secondary | ICD-10-CM | POA: Insufficient documentation

## 2022-04-20 DIAGNOSIS — R110 Nausea: Secondary | ICD-10-CM | POA: Insufficient documentation

## 2022-04-20 DIAGNOSIS — C679 Malignant neoplasm of bladder, unspecified: Secondary | ICD-10-CM

## 2022-04-20 DIAGNOSIS — R630 Anorexia: Secondary | ICD-10-CM | POA: Diagnosis not present

## 2022-04-20 DIAGNOSIS — C786 Secondary malignant neoplasm of retroperitoneum and peritoneum: Secondary | ICD-10-CM | POA: Insufficient documentation

## 2022-04-20 DIAGNOSIS — R634 Abnormal weight loss: Secondary | ICD-10-CM | POA: Insufficient documentation

## 2022-04-20 DIAGNOSIS — Z5112 Encounter for antineoplastic immunotherapy: Secondary | ICD-10-CM | POA: Insufficient documentation

## 2022-04-20 LAB — CBC WITH DIFFERENTIAL (CANCER CENTER ONLY)
Abs Immature Granulocytes: 0.04 10*3/uL (ref 0.00–0.07)
Basophils Absolute: 0.1 10*3/uL (ref 0.0–0.1)
Basophils Relative: 1 %
Eosinophils Absolute: 0.4 10*3/uL (ref 0.0–0.5)
Eosinophils Relative: 6 %
HCT: 39.1 % (ref 36.0–46.0)
Hemoglobin: 12.8 g/dL (ref 12.0–15.0)
Immature Granulocytes: 1 %
Lymphocytes Relative: 18 %
Lymphs Abs: 1.1 10*3/uL (ref 0.7–4.0)
MCH: 29.8 pg (ref 26.0–34.0)
MCHC: 32.7 g/dL (ref 30.0–36.0)
MCV: 91.1 fL (ref 80.0–100.0)
Monocytes Absolute: 0.7 10*3/uL (ref 0.1–1.0)
Monocytes Relative: 11 %
Neutro Abs: 3.9 10*3/uL (ref 1.7–7.7)
Neutrophils Relative %: 63 %
Platelet Count: 182 10*3/uL (ref 150–400)
RBC: 4.29 MIL/uL (ref 3.87–5.11)
RDW: 15.3 % (ref 11.5–15.5)
WBC Count: 6.1 10*3/uL (ref 4.0–10.5)
nRBC: 0 % (ref 0.0–0.2)

## 2022-04-20 LAB — CMP (CANCER CENTER ONLY)
ALT: 11 U/L (ref 0–44)
AST: 24 U/L (ref 15–41)
Albumin: 4.1 g/dL (ref 3.5–5.0)
Alkaline Phosphatase: 72 U/L (ref 38–126)
Anion gap: 8 (ref 5–15)
BUN: 23 mg/dL (ref 8–23)
CO2: 23 mmol/L (ref 22–32)
Calcium: 9.3 mg/dL (ref 8.9–10.3)
Chloride: 108 mmol/L (ref 98–111)
Creatinine: 2.1 mg/dL — ABNORMAL HIGH (ref 0.44–1.00)
GFR, Estimated: 24 mL/min — ABNORMAL LOW (ref >60)
Glucose, Bld: 89 mg/dL (ref 70–99)
Potassium: 4.4 mmol/L (ref 3.5–5.1)
Sodium: 139 mmol/L (ref 135–145)
Total Bilirubin: 0.4 mg/dL (ref 0.3–1.2)
Total Protein: 7.3 g/dL (ref 6.5–8.1)

## 2022-04-20 NOTE — Progress Notes (Signed)
Hematology and Oncology Follow Up Visit  Alexandra Little [id number] 07-06-1945 77 y.o. 04/20/2022 8:05 AM [name], MDShadad, [name], MD   Principle Diagnosis: 77 year old woman with bladder cancer diagnosed in March 2021.  She was found to have stage IV high-grade urothelial carcinoma, FGFR positive mutation and pelvic adenopathy.  Prior Therapy:  She was treated with carboplatin and gemcitabine with the last infusion in March 2021.  She had severe cytopenias after 5 cycles of therapy and was switched to Avelumab in April 2021.    In July 2021 showed worsening of disease occluding retroperitoneal and inguinal adenopathy.  He subsequently was switched to [facility] in September 2021 therapy was poorly tolerated at day 1, 8 and 15 at 28-day cycle.  He subsequently was switched to 1 mg/kg day 1 day 15 out of 28-day cycle.    Last CT scan in February 2022 showed near complete response.   Padcev 1 mg/kg day 1 and day 15 of each cycle.  She received 1 cycle of therapy in March 2022 and discontinued on July 17, 2020 due to patient preference and achieving complete response.   Patient developed relapsed disease in October 2023.   Current therapy: Padcev 1 mg/kg day 1 and day 15 of each cycle.  She received day 1 of cycle 1 on February 01, 2022.  She is here for day day 15 of cycle 4.  Interim History: [name] presents today for repeat follow-up.  Since last visit, she reports no major changes in her health.  She continues to tolerate Padcev with few complaints.  She has reported predominantly GI issues including mild nausea decrease in her appetite she has lost some weight.  She denies any vomiting, diarrhea or excessive fatigue or tiredness.  She denies any hospitalizations or illnesses.  She continues to be active and planning to have another vacation and travel in February.  She denies any worsening neuropathy, falls or syncope.      Medications: Reviewed without changes. Current Outpatient  Medications  Medication Sig Dispense Refill   acetaminophen (TYLENOL) 500 MG tablet Take 1,000 mg by mouth every 6 (six) hours as needed for mild pain or headache.     albuterol (VENTOLIN HFA) 108 (90 Base) MCG/ACT inhaler Inhale 2 puffs into the lungs every 4 (four) hours as needed for shortness of breath.     amoxicillin (AMOXIL) 500 MG tablet Take 1 tablet (500 mg total) by mouth 2 (two) times daily. 8 tablet 0   apixaban (ELIQUIS) 5 MG TABS tablet TAKE 1 TABLET(5 MG) BY MOUTH TWICE DAILY 60 tablet 3   diphenhydrAMINE (BENADRYL) 25 mg capsule Take 25 mg by mouth every 6 (six) hours as needed for itching.     famotidine-calcium carbonate-magnesium hydroxide (PEPCID COMPLETE) 10-800-165 MG chewable tablet Chew 1 tablet by mouth 2 (two) times daily as needed (heartburn/indigestion.).     fluticasone-salmeterol (ADVAIR) 250-50 MCG/ACT AEPB Inhale 1 puff into the lungs in the morning and at bedtime.     guaiFENesin (MUCINEX) 600 MG 12 hr tablet Take 1 tablet (600 mg total) by mouth 2 (two) times daily. 30 tablet 1   hydrOXYzine (ATARAX) 10 MG tablet Take 1 tablet (10 mg total) by mouth every 6 (six) hours as needed. 60 tablet 3   Meth-Hyo-M Bl-Na Phos-Ph Sal (URIBEL) 118 MG CAPS Take 1 capsule (118 mg total) by mouth every 8 (eight) hours as needed. 30 capsule 5   MYRBETRIQ 50 MG TB24 tablet Take 50 mg by mouth daily.  predniSONE (DELTASONE) 20 MG tablet Take 1 tablet (20 mg total) by mouth daily with breakfast. Take three tabs for 4 days, then take two tabs for 4 days. Take one tab for 4 days. 30 tablet 1   No current facility-administered medications for this visit.     Allergies:  Allergies  Allergen Reactions   Lisinopril Anaphylaxis   Chlorhexidine Itching   Fish Oil     Other reaction(s): tongue/lip swelling   Iodine Swelling    Patient states she either breaks out in hives or her tongue swells   Latex Itching   Salmon Oil [Nutritional Supplements] Swelling      Physical  Exam:       Blood pressure (!) 141/74, pulse 90, temperature 97.8 F (36.6 C), temperature source Temporal, resp. rate 18, height '5\' 4"'$  (1.626 m), weight 190 lb 6.4 oz (86.4 kg), SpO2 97 %.           ECOG: 1     General appearance: Alert, awake without any distress. Head: Atraumatic without abnormalities Oropharynx: Without any thrush or ulcers. Eyes: No scleral icterus. Lymph nodes: No lymphadenopathy noted in the cervical, supraclavicular, or axillary nodes Heart:regular rate and rhythm, without any murmurs or gallops.   Lung: Clear to auscultation without any rhonchi, wheezes or dullness to percussion. Abdomin: Soft, nontender without any shifting dullness or ascites. Musculoskeletal: No clubbing or cyanosis. Neurological: No motor or sensory deficits. Skin: No rashes or lesions.              Lab Results: Lab Results  Component Value Date   WBC 7.1 04/06/2022   HGB 13.3 04/06/2022   HCT 40.8 04/06/2022   MCV 90.7 04/06/2022   PLT 209 04/06/2022     Chemistry      Component Value Date/Time   NA 141 04/06/2022 1402   K 4.6 04/06/2022 1402   CL 110 04/06/2022 1402   CO2 23 04/06/2022 1402   BUN 26 (H) 04/06/2022 1402   CREATININE 2.04 (H) 04/06/2022 1402      Component Value Date/Time   CALCIUM 9.7 04/06/2022 1402   ALKPHOS 80 04/06/2022 1402   AST 22 04/06/2022 1402   ALT 11 04/06/2022 1402   BILITOT 0.4 04/06/2022 1402      IMPRESSION: 1. Mixed response to therapy as evidenced by resolution of a hepatic metastasis but with a new intensely hypermetabolic portacaval lymph node. 2. Similar hypermetabolic lesion in the splenic hilum, worrisome for metastatic disease. Again, MR abdomen without and with contrast could be performed in further evaluation, as clinically indicated. 3. Similar minimally hypermetabolic external iliac lymph nodes. 4. Chronic bilateral hydronephrosis with left renal atrophy. Chronic bladder wall thickening and  perivesical inflammatory stranding. 5. Liver appears steatotic and possibly cirrhotic. 6.  Aortic atherosclerosis (ICD10-I70.0).   Impression and Plan:   77 year old woman with:   1.  Bladder cancer diagnosed in 2021.  She was found to have stage IV high-grade urothelial carcinoma with peritoneal involvement and adenopathy.  She achieved complete response and subsequently relapsed in 2023.  She is currently on Padcev and completing cycle 4 of therapy over retreatment.  PET scan obtained on April 16, 2022 was personally reviewed today and showed mixed response.  Her hepatic lymph nodes did respond with overall stable disease in the external iliac lymph nodes.  There is a new hypermetabolic portacaval lymph node.  Treatment options discussed at this time which include continuing status of versus switching to FGFR based therapy.  Given her  overall well positive response to therapy and recommended continuing the same dose and schedule and repeat imaging studies after 3 more cycles.  As long as she continues to have positive response, I recommend continuing this treatment.  She is agreeable to proceed with treatment at this time and repeat imaging studies in 3 months.   2.  IV access: Peripheral veins are currently in use.  Port-A-Cath has been deferred at this time.   3.  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   4.  Hydronephrosis: To her malignancy and continues to follow with Dr. Alinda Money with the pediatric stent change.   5.  Follow-up: She will continue to follow every 2 weeks for her treatment.  Chemotherapy and repeat evaluation in 4 weeks.   30  minutes were dedicated to this encounter.  The time spent on updating disease status, treatment choices and outlining future plan of care reviewed.  Zola Button, MD 1/9/20248:05 AM

## 2022-04-21 ENCOUNTER — Inpatient Hospital Stay: Payer: Medicare Other

## 2022-04-21 VITALS — BP 148/90 | HR 87 | Temp 98.1°F | Resp 18

## 2022-04-21 DIAGNOSIS — T451X5A Adverse effect of antineoplastic and immunosuppressive drugs, initial encounter: Secondary | ICD-10-CM | POA: Diagnosis not present

## 2022-04-21 DIAGNOSIS — R634 Abnormal weight loss: Secondary | ICD-10-CM | POA: Diagnosis not present

## 2022-04-21 DIAGNOSIS — C786 Secondary malignant neoplasm of retroperitoneum and peritoneum: Secondary | ICD-10-CM | POA: Diagnosis not present

## 2022-04-21 DIAGNOSIS — G620 Drug-induced polyneuropathy: Secondary | ICD-10-CM | POA: Diagnosis not present

## 2022-04-21 DIAGNOSIS — Z79899 Other long term (current) drug therapy: Secondary | ICD-10-CM | POA: Diagnosis not present

## 2022-04-21 DIAGNOSIS — C679 Malignant neoplasm of bladder, unspecified: Secondary | ICD-10-CM | POA: Diagnosis not present

## 2022-04-21 DIAGNOSIS — Z5112 Encounter for antineoplastic immunotherapy: Secondary | ICD-10-CM | POA: Diagnosis not present

## 2022-04-21 DIAGNOSIS — R110 Nausea: Secondary | ICD-10-CM | POA: Diagnosis not present

## 2022-04-21 DIAGNOSIS — N1339 Other hydronephrosis: Secondary | ICD-10-CM | POA: Diagnosis not present

## 2022-04-21 MED ORDER — PROCHLORPERAZINE MALEATE 10 MG PO TABS
10.0000 mg | ORAL_TABLET | Freq: Once | ORAL | Status: AC
  Administered 2022-04-21: 10 mg via ORAL
  Filled 2022-04-21: qty 1

## 2022-04-21 MED ORDER — SODIUM CHLORIDE 0.9 % IV SOLN
1.0200 mg/kg | Freq: Once | INTRAVENOUS | Status: AC
  Administered 2022-04-21: 90 mg via INTRAVENOUS
  Filled 2022-04-21: qty 9

## 2022-04-21 MED ORDER — SODIUM CHLORIDE 0.9 % IV SOLN: Freq: Once | INTRAVENOUS | Status: AC

## 2022-04-21 NOTE — Patient Instructions (Signed)
[facility]  Discharge Instructions: Thank you for choosing [facility] to provide your oncology and hematology care.   If you have a lab appointment with the [facility], please go directly to the [facility] and check in at the registration area.   Wear comfortable clothing and clothing appropriate for easy access to any Portacath or PICC line.   We strive to give you quality time with your provider. You may need to reschedule your appointment if you arrive late (15 or more minutes).  Arriving late affects you and other patients whose appointments are after yours.  Also, if you miss three or more appointments without notifying the office, you may be dismissed from the clinic at the provider's discretion.      For prescription refill requests, have your pharmacy contact our office and allow 72 hours for refills to be completed.    Today you received the following chemotherapy and/or immunotherapy agents padcev      To help prevent nausea and vomiting after your treatment, we encourage you to take your nausea medication as directed.  BELOW ARE SYMPTOMS THAT SHOULD BE REPORTED IMMEDIATELY: *FEVER GREATER THAN 100.4 F (38 C) OR HIGHER *CHILLS OR SWEATING *NAUSEA AND VOMITING THAT IS NOT CONTROLLED WITH YOUR NAUSEA MEDICATION *UNUSUAL SHORTNESS OF BREATH *UNUSUAL BRUISING OR BLEEDING *URINARY PROBLEMS (pain or burning when urinating, or frequent urination) *BOWEL PROBLEMS (unusual diarrhea, constipation, pain near the anus) TENDERNESS IN MOUTH AND THROAT WITH OR WITHOUT PRESENCE OF ULCERS (sore throat, sores in mouth, or a toothache) UNUSUAL RASH, SWELLING OR PAIN  UNUSUAL VAGINAL DISCHARGE OR ITCHING   Items with * indicate a potential emergency and should be followed up as soon as possible or go to the Emergency Department if any problems should occur.  Please show the CHEMOTHERAPY ALERT CARD or IMMUNOTHERAPY ALERT CARD at check-in to the  Emergency Department and triage nurse.  Should you have questions after your visit or need to cancel or reschedule your appointment, please contact [facility]  Dept: [phone]  and follow the prompts.  Office hours are 8:00 a.m. to 4:30 p.m. Monday - Friday. Please note that voicemails left after 4:00 p.m. may not be returned until the following business day.  We are closed weekends and major holidays. You have access to a nurse at all times for urgent questions. Please call the main number to the clinic Dept: [phone] and follow the prompts.   For any non-urgent questions, you may also contact your provider using MyChart. We now offer e-Visits for anyone 74 and older to request care online for non-urgent symptoms. For details visit [email or web address].   Also download the MyChart app! Go to the app store, search "MyChart", open the app, select [facility], and log in with your MyChart username and password.  Masks are optional in the cancer centers. If you would like for your care team to wear a mask while they are taking care of you, please let them know. You may have one support person who is at least 78 years old accompany you for your appointments.

## 2022-04-23 ENCOUNTER — Other Ambulatory Visit: Payer: Self-pay

## 2022-04-24 ENCOUNTER — Other Ambulatory Visit: Payer: Self-pay

## 2022-05-04 ENCOUNTER — Inpatient Hospital Stay: Payer: Medicare Other

## 2022-05-04 ENCOUNTER — Other Ambulatory Visit: Payer: Self-pay

## 2022-05-04 VITALS — BP 117/56 | HR 78 | Temp 98.5°F | Resp 17 | Wt 181.8 lb

## 2022-05-04 DIAGNOSIS — N1339 Other hydronephrosis: Secondary | ICD-10-CM | POA: Diagnosis not present

## 2022-05-04 DIAGNOSIS — G620 Drug-induced polyneuropathy: Secondary | ICD-10-CM | POA: Diagnosis not present

## 2022-05-04 DIAGNOSIS — Z5112 Encounter for antineoplastic immunotherapy: Secondary | ICD-10-CM | POA: Diagnosis not present

## 2022-05-04 DIAGNOSIS — Z79899 Other long term (current) drug therapy: Secondary | ICD-10-CM | POA: Diagnosis not present

## 2022-05-04 DIAGNOSIS — C679 Malignant neoplasm of bladder, unspecified: Secondary | ICD-10-CM

## 2022-05-04 DIAGNOSIS — C786 Secondary malignant neoplasm of retroperitoneum and peritoneum: Secondary | ICD-10-CM | POA: Diagnosis not present

## 2022-05-04 DIAGNOSIS — R110 Nausea: Secondary | ICD-10-CM | POA: Diagnosis not present

## 2022-05-04 DIAGNOSIS — R634 Abnormal weight loss: Secondary | ICD-10-CM | POA: Diagnosis not present

## 2022-05-04 DIAGNOSIS — T451X5A Adverse effect of antineoplastic and immunosuppressive drugs, initial encounter: Secondary | ICD-10-CM | POA: Diagnosis not present

## 2022-05-04 LAB — CMP (CANCER CENTER ONLY)
ALT: 15 U/L (ref 0–44)
AST: 36 U/L (ref 15–41)
Albumin: 4 g/dL (ref 3.5–5.0)
Alkaline Phosphatase: 77 U/L (ref 38–126)
Anion gap: 8 (ref 5–15)
BUN: 21 mg/dL (ref 8–23)
CO2: 22 mmol/L (ref 22–32)
Calcium: 9.6 mg/dL (ref 8.9–10.3)
Chloride: 109 mmol/L (ref 98–111)
Creatinine: 2.22 mg/dL — ABNORMAL HIGH (ref 0.44–1.00)
GFR, Estimated: 22 mL/min — ABNORMAL LOW (ref >60)
Glucose, Bld: 102 mg/dL — ABNORMAL HIGH (ref 70–99)
Potassium: 4.3 mmol/L (ref 3.5–5.1)
Sodium: 139 mmol/L (ref 135–145)
Total Bilirubin: 0.4 mg/dL (ref 0.3–1.2)
Total Protein: 7.1 g/dL (ref 6.5–8.1)

## 2022-05-04 LAB — CBC WITH DIFFERENTIAL (CANCER CENTER ONLY)
Abs Immature Granulocytes: 0.04 10*3/uL (ref 0.00–0.07)
Basophils Absolute: 0.1 10*3/uL (ref 0.0–0.1)
Basophils Relative: 1 %
Eosinophils Absolute: 0.3 10*3/uL (ref 0.0–0.5)
Eosinophils Relative: 5 %
HCT: 38.8 % (ref 36.0–46.0)
Hemoglobin: 12.9 g/dL (ref 12.0–15.0)
Immature Granulocytes: 1 %
Lymphocytes Relative: 17 %
Lymphs Abs: 1 10*3/uL (ref 0.7–4.0)
MCH: 29.7 pg (ref 26.0–34.0)
MCHC: 33.2 g/dL (ref 30.0–36.0)
MCV: 89.4 fL (ref 80.0–100.0)
Monocytes Absolute: 0.6 10*3/uL (ref 0.1–1.0)
Monocytes Relative: 9 %
Neutro Abs: 4 10*3/uL (ref 1.7–7.7)
Neutrophils Relative %: 67 %
Platelet Count: 210 10*3/uL (ref 150–400)
RBC: 4.34 MIL/uL (ref 3.87–5.11)
RDW: 15.3 % (ref 11.5–15.5)
WBC Count: 6 10*3/uL (ref 4.0–10.5)
nRBC: 0 % (ref 0.0–0.2)

## 2022-05-04 MED ORDER — PROCHLORPERAZINE MALEATE 10 MG PO TABS
10.0000 mg | ORAL_TABLET | Freq: Once | ORAL | Status: AC
  Administered 2022-05-04: 10 mg via ORAL
  Filled 2022-05-04: qty 1

## 2022-05-04 MED ORDER — SODIUM CHLORIDE 0.9 % IV SOLN: Freq: Once | INTRAVENOUS | Status: AC

## 2022-05-04 MED ORDER — SODIUM CHLORIDE 0.9 % IV SOLN
1.0200 mg/kg | Freq: Once | INTRAVENOUS | Status: AC
  Administered 2022-05-04: 90 mg via INTRAVENOUS
  Filled 2022-05-04: qty 9

## 2022-05-12 ENCOUNTER — Other Ambulatory Visit: Payer: Self-pay | Admitting: *Deleted

## 2022-05-12 DIAGNOSIS — C679 Malignant neoplasm of bladder, unspecified: Secondary | ICD-10-CM

## 2022-05-14 ENCOUNTER — Other Ambulatory Visit: Payer: Self-pay

## 2022-05-17 ENCOUNTER — Other Ambulatory Visit: Payer: Self-pay | Admitting: Oncology

## 2022-05-17 ENCOUNTER — Inpatient Hospital Stay: Payer: Medicare Other | Admitting: Oncology

## 2022-05-17 ENCOUNTER — Inpatient Hospital Stay: Payer: Medicare Other | Attending: Oncology

## 2022-05-17 ENCOUNTER — Encounter: Payer: Self-pay | Admitting: Oncology

## 2022-05-17 DIAGNOSIS — G620 Drug-induced polyneuropathy: Secondary | ICD-10-CM | POA: Insufficient documentation

## 2022-05-17 DIAGNOSIS — N189 Chronic kidney disease, unspecified: Secondary | ICD-10-CM | POA: Diagnosis not present

## 2022-05-17 DIAGNOSIS — C679 Malignant neoplasm of bladder, unspecified: Secondary | ICD-10-CM

## 2022-05-17 DIAGNOSIS — Z86711 Personal history of pulmonary embolism: Secondary | ICD-10-CM | POA: Insufficient documentation

## 2022-05-17 DIAGNOSIS — J45909 Unspecified asthma, uncomplicated: Secondary | ICD-10-CM | POA: Insufficient documentation

## 2022-05-17 DIAGNOSIS — T451X5A Adverse effect of antineoplastic and immunosuppressive drugs, initial encounter: Secondary | ICD-10-CM | POA: Diagnosis not present

## 2022-05-17 DIAGNOSIS — Z7901 Long term (current) use of anticoagulants: Secondary | ICD-10-CM | POA: Insufficient documentation

## 2022-05-17 LAB — CBC WITH DIFFERENTIAL (CANCER CENTER ONLY)
Abs Immature Granulocytes: 0.06 10*3/uL (ref 0.00–0.07)
Basophils Absolute: 0.1 10*3/uL (ref 0.0–0.1)
Basophils Relative: 1 %
Eosinophils Absolute: 0.4 10*3/uL (ref 0.0–0.5)
Eosinophils Relative: 5 %
HCT: 39.5 % (ref 36.0–46.0)
Hemoglobin: 12.9 g/dL (ref 12.0–15.0)
Immature Granulocytes: 1 %
Lymphocytes Relative: 17 %
Lymphs Abs: 1.2 10*3/uL (ref 0.7–4.0)
MCH: 29.3 pg (ref 26.0–34.0)
MCHC: 32.7 g/dL (ref 30.0–36.0)
MCV: 89.6 fL (ref 80.0–100.0)
Monocytes Absolute: 0.8 10*3/uL (ref 0.1–1.0)
Monocytes Relative: 11 %
Neutro Abs: 4.5 10*3/uL (ref 1.7–7.7)
Neutrophils Relative %: 65 %
Platelet Count: 223 10*3/uL (ref 150–400)
RBC: 4.41 MIL/uL (ref 3.87–5.11)
RDW: 16 % — ABNORMAL HIGH (ref 11.5–15.5)
WBC Count: 6.9 10*3/uL (ref 4.0–10.5)
nRBC: 0 % (ref 0.0–0.2)

## 2022-05-17 LAB — CMP (CANCER CENTER ONLY)
ALT: 21 U/L (ref 0–44)
AST: 48 U/L — ABNORMAL HIGH (ref 15–41)
Albumin: 4.2 g/dL (ref 3.5–5.0)
Alkaline Phosphatase: 66 U/L (ref 38–126)
Anion gap: 12 (ref 5–15)
BUN: 14 mg/dL (ref 8–23)
CO2: 22 mmol/L (ref 22–32)
Calcium: 9.6 mg/dL (ref 8.9–10.3)
Chloride: 106 mmol/L (ref 98–111)
Creatinine: 2 mg/dL — ABNORMAL HIGH (ref 0.44–1.00)
GFR, Estimated: 25 mL/min — ABNORMAL LOW (ref >60)
Glucose, Bld: 110 mg/dL — ABNORMAL HIGH (ref 70–99)
Potassium: 4.2 mmol/L (ref 3.5–5.1)
Sodium: 140 mmol/L (ref 135–145)
Total Bilirubin: 0.5 mg/dL (ref 0.3–1.2)
Total Protein: 7.6 g/dL (ref 6.5–8.1)

## 2022-05-17 NOTE — Progress Notes (Signed)
Reports just feels lonely at times since they moved from [place] and have no friends here yet and no activities to keep her busy. She is researching a new retirement village with more active people and program.

## 2022-05-17 NOTE — Progress Notes (Signed)
[facility] OFFICE PROGRESS NOTE   Diagnosis: Bladder cancer  INTERVAL HISTORY:   Alexandra Little was diagnosed with metastatic bladder cancer after undergoing a TURBT on 01/25/2019.  She had bilateral ureteral obstruction requiring bilateral PCNs.  She was treated with gemcitabine/carboplatin beginning 04/03/2019 and completed 5 cycles, last given 07/10/2019.  CTs on 06/26/2019 revealed a response to chemotherapy.  She had significant anemia and thrombocytopenia requiring transfusions and treatment delays despite dose reductions.  Treatment was switched to Avelumab as first-line maintenance beginning 07/31/2019.  A CT on 10/23/2019 showed worsening pelvic, retroperitoneal, and inguinal lymphadenopathy.  A decision was made to continue treatment as the changes were felt to potentially represent pseudo progression.  Avelumab was continued.  CTs on 01/01/2020 showed worsening pelvic, retroperitoneal, and inguinal lymphadenopathy.  There was diffuse urinary bladder wall thickening with possible invasion into the uterus.  Treatment was changed to Padcev beginning 12/19/2019.  Day 15 cycle 1 was canceled due to severe fatigue affecting her quality of life.  Treatment was resumed with a dose reduction to 1 mg/kilogram beginning 03/04/2020.  CTs on 03/04/2020 showed a response to treatment.  Repeat CTs on 06/03/2020 showed continued response to treatment.  [facility] NGS panel confirmed and FGFR3 mutation, tumor mutation burden 3.7, and MSS status.  She relocated to [facility] and has been followed by Dr. Alen Blew.  Treatment with Alexandra Little was continued beginning March 2022 and discontinued 07/17/2020 due to patient preference and evidence of a complete response  She remained off of treatment until October 2023 when she developed progressive disease.  Treatment was resumed 02/01/2022 and was last given on 05/04/2022.  She reports marked fatigue and anorexia following each cycle of Padcev.  Her symptoms  lasted for a few days prior to the next scheduled treatment.  She has persistent "neuropathy "in the feet.  This has not changed since resuming Padcev.  She would like another treatment break.  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  She is a retired Radio producer.  She does not use cigarettes.  She reports occasional alcohol use.  She has received transfusions.  No risk factor for HIV or hepatitis.  Of systems: Positives-increased flatulence following Padcev, anorexia following Padcev, intermittent loose stool up to 2-3 times per day, numbness in the feet, cramping of the legs-hands-toes, occasional dysphagia, heartburn  A complete review of systems was otherwise negative Objective:  Vital signs in last 24 hours:  Blood pressure 129/73, pulse 99, temperature 98.1 F (36.7 C), temperature source Tympanic, resp. rate 18, weight 183 lb 9.6 oz (83.3 kg), SpO2 96 %.    HEENT: Oropharynx without thrush or ulcers Lymphatics: No cervical, supraclavicular, axillary, or inguinal nodes Resp: Lungs clear bilaterally Cardio: Regular rate and rhythm GI: No hepatosplenomegaly Vascular: No leg edema Neuro: The motor exam is intact in the upper and lower extremities bilaterally    Lab Results:  Lab Results  Component Value Date   WBC 6.9 05/17/2022   HGB 12.9 05/17/2022   HCT 39.5 05/17/2022   MCV 89.6 05/17/2022   PLT 223 05/17/2022   NEUTROABS 4.5 05/17/2022    CMP  Lab Results  Component Value Date   NA 140 05/17/2022   K 4.2 05/17/2022   CL 106 05/17/2022   CO2 22 05/17/2022   GLUCOSE 110 (H)  05/17/2022   BUN 14 05/17/2022   CREATININE 2.00 (H) 05/17/2022   CALCIUM 9.6 05/17/2022   PROT 7.6 05/17/2022   ALBUMIN 4.2 05/17/2022   AST 48 (H) 05/17/2022    ALT 21 05/17/2022   ALKPHOS 66 05/17/2022   BILITOT 0.5 05/17/2022   GFRNONAA 25 (L) 05/17/2022    No results found for: "CEA1", "CEA", "QPY195", "CA125"  Lab Results  Component Value Date   INR 1.5 (H) 04/09/2021   LABPROT 18.0 (H) 04/09/2021    Imaging:  Per HPI: PET images from 12/24/2021 and 04/16/2022 reviewed  Medications: I have reviewed the patient's current medications.   Assessment/Plan: 1.  Bladder cancer, stage IV, FGFR3 mutation, MSS, tumor mutation burden 2.7 on [employer] testing 01/24/2021-TURBT diagnosed with muscle invasive bladder cancer Lymph node metastases on PET, bilateral ureteral obstruction requiring bilateral PCNs Admission to [facility] hospital 03/13/2019 - 03/16/2019 with gross hematuria requiring cystoscopy, clot evacuation, repeat TURBT, and fulguration Gemcitabine/carboplatin 04/03/2019 - 0/93/2671, 5 cycles complicated by significant anemia/thrombocytopenia requiring transfusion and treatment delays despite dose reductions CT 06/26/2019-positive response to chemotherapy Maintenance Avelumab 07/31/2019 CTs 07/31/2019-worsening pelvic, retroperitoneal, and inguinal lymphadenopathy-pseudo progression?,  Avelumab continued CTs 01/01/2020-progressive lymphadenopathy and diffuse bladder wall thickening with possible invasion of the uterus-durvalumab discontinued Padcev 01/08/2020, day 15 canceled due to patient request with severe fatigue Padcev resumed at a dose of 1 mg/kg beginning 03/04/2020 CTs 04/15/2020-positive treatment response with decreased urinary bladder wall thickening and improved lymphadenopathy Relocated to [facility], treatment continued March 2022 and discontinued 07/17/2020 due to patient preference and evidence of a complete response CTs 08/21/2020-no evidence for metastatic disease CTs 11/30/2021-persistent hydronephrosis and hydroureter, urinary bladder wall thickening, borderline enlarged porta hepatis node, subtle hypodensity in the liver just above  the gallbladder fossa PET 12/25/2018 23-3 separate foci of intense hypermetabolic activity at the liver, porta hepatis, and splenic hilum no evidence of metastatic disease in the chest, unchanged hydronephrosis and chronic bladder wall thickening Padcev 1 mg/kilogram on day 1/day 15 schedule beginning 02/01/2022, last given 05/04/2022 PET 04/16/2022-mixed response with resolution of a hepatic metastasis, new hypermetabolic portacaval node, similar hypermetabolic lesion at the splenic hilum, chronic bilateral hydronephrosis and bladder wall thickening, osteotic and possibly cirrhotic liver  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  Asthma 5.  Chronic renal failure   Disposition: Ms. Krontz was diagnosed with metastatic bladder cancer in October 2020.  She has been maintained on multiple systemic therapies over the past several years, most recently enfortumab vedotin.  I discussed the 04/16/2022 PET findings with her.  I reviewed the images.  The images are consistent with a mixed response with residual hypermetabolism involving a portacaval node and the splenic hilum.  She reports her quality of life is significantly affected with the current treatment.  She develops anorexia, flatulence, and malaise throughout the 2 weeks following each treatment.  She would like a treatment break.  We discussed additional salvage systemic therapy options.  She plans to leave on a cruise later this week.  She will return for an office visit and further discussion on 06/07/2022.Marland Kitchen  We may consider resuming enfortumab vedotin, continuing a treatment break, or changing to a different systemic regimen.    Betsy Coder, MD  05/17/2022  2:34 PM

## 2022-05-18 ENCOUNTER — Inpatient Hospital Stay: Payer: Medicare Other

## 2022-05-18 ENCOUNTER — Other Ambulatory Visit: Payer: Self-pay

## 2022-06-07 ENCOUNTER — Inpatient Hospital Stay: Payer: Medicare Other | Admitting: Oncology

## 2022-06-07 VITALS — BP 147/75 | HR 90 | Temp 98.1°F | Resp 18 | Ht 64.0 in | Wt 186.0 lb

## 2022-06-07 DIAGNOSIS — Z7901 Long term (current) use of anticoagulants: Secondary | ICD-10-CM | POA: Diagnosis not present

## 2022-06-07 DIAGNOSIS — T451X5A Adverse effect of antineoplastic and immunosuppressive drugs, initial encounter: Secondary | ICD-10-CM | POA: Diagnosis not present

## 2022-06-07 DIAGNOSIS — J45909 Unspecified asthma, uncomplicated: Secondary | ICD-10-CM | POA: Diagnosis not present

## 2022-06-07 DIAGNOSIS — C679 Malignant neoplasm of bladder, unspecified: Secondary | ICD-10-CM | POA: Diagnosis not present

## 2022-06-07 DIAGNOSIS — Z86711 Personal history of pulmonary embolism: Secondary | ICD-10-CM | POA: Diagnosis not present

## 2022-06-07 DIAGNOSIS — N189 Chronic kidney disease, unspecified: Secondary | ICD-10-CM | POA: Diagnosis not present

## 2022-06-07 DIAGNOSIS — G620 Drug-induced polyneuropathy: Secondary | ICD-10-CM | POA: Diagnosis not present

## 2022-06-07 NOTE — Progress Notes (Signed)
[place] OFFICE PROGRESS NOTE   Diagnosis: Bladder cancer  INTERVAL HISTORY:   Alexandra Little turns as scheduled.  She went on vacation for saw her earlier this month.  She feels well.  Good appetite.  She continues to have numbness in the feet.  She watches the ground closely so she does not stumble.  No hematuria.  Objective:  Vital signs in last 24 hours:  Blood pressure (!) 147/75, pulse 90, temperature 98.1 F (36.7 C), temperature source Oral, resp. rate 18, height '5\' 4"'$  (1.626 m), weight 186 lb (84.4 kg), SpO2 98 %.     Lymphatics: No cervical, supraclavicular, axillary, or inguinal nodes Resp: Lungs clear bilaterally Cardio: Regular rate and rhythm GI: No mass, nontender, no hepatosplenomegaly Vascular: No leg edema  Lab Results:  Lab Results  Component Value Date   WBC 6.9 05/17/2022   HGB 12.9 05/17/2022   HCT 39.5 05/17/2022   MCV 89.6 05/17/2022   PLT 223 05/17/2022   NEUTROABS 4.5 05/17/2022    CMP  Lab Results  Component Value Date   NA 140 05/17/2022   K 4.2 05/17/2022   CL 106 05/17/2022   CO2 22 05/17/2022   GLUCOSE 110 (H) 05/17/2022   BUN 14 05/17/2022   CREATININE 2.00 (H) 05/17/2022   CALCIUM 9.6 05/17/2022   PROT 7.6 05/17/2022   ALBUMIN 4.2 05/17/2022   AST 48 (H) 05/17/2022   ALT 21 05/17/2022   ALKPHOS 66 05/17/2022   BILITOT 0.5 05/17/2022   GFRNONAA 25 (L) 05/17/2022     Medications: I have reviewed the patient's current medications.   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 Lymph node metastases on PET, bilateral ureteral obstruction requiring bilateral PCNs Admission to [facility] 03/13/2019 - 03/16/2019 with gross hematuria requiring cystoscopy, clot evacuation, repeat TURBT, and fulguration Gemcitabine/carboplatin 04/03/2019 - [id number], 5 cycles complicated by  significant anemia/thrombocytopenia requiring transfusion and treatment delays despite dose reductions CT 06/26/2019-positive response to chemotherapy Maintenance Avelumab 07/31/2019 CTs 07/31/2019-worsening pelvic, retroperitoneal, and inguinal lymphadenopathy-pseudo progression?,  Avelumab continued CTs 01/01/2020-progressive lymphadenopathy and diffuse bladder wall thickening with possible invasion of the uterus-avelumab discontinued Padcev 01/08/2020, day 15 canceled due to patient request with severe fatigue Padcev resumed at a dose of 1 mg/kg beginning 03/04/2020 CTs 04/15/2020-positive treatment response with decreased urinary bladder wall thickening and improved lymphadenopathy Relocated to [facility], treatment continued March 2022 and discontinued 07/17/2020 due to patient preference and evidence of a complete response CTs 08/21/2020-no evidence for metastatic disease CTs 11/30/2021-persistent hydronephrosis and hydroureter, urinary bladder wall thickening, borderline enlarged porta hepatis node, subtle hypodensity in the liver just above the gallbladder fossa PET 12/25/2018 23-3 separate foci of intense hypermetabolic activity at the liver, porta hepatis, and splenic hilum no evidence of metastatic disease in the chest, unchanged hydronephrosis and chronic bladder wall thickening Padcev 1 mg/kilogram on day 1/day 15 schedule beginning 02/01/2022, last given 05/04/2022 PET 04/16/2022-mixed response with resolution of a hepatic metastasis, new hypermetabolic portacaval node, similar hypermetabolic lesion at the splenic hilum, chronic bilateral hydronephrosis and bladder wall thickening, steatotic and possibly cirrhotic liver  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  Asthma 5.  Chronic renal failure     Disposition: Alexandra Little appears stable.  She appears asymptomatic from the metastatic bladder  cancer.  She has persistent neuropathy in the  feet.  We discussed treatment options including a continued treatment break, resuming Padcev, repeat treatment with gemcitabine/carboplatin, and FGFR3 directed therapy.   She does not wish to consider gemcitabine/carboplatin.  She had significant hematologic toxicity with this regimen.  We decided to continue a treatment break.  She will return for an office visit and restaging PET scan in 2 months.  We will consider resuming enfortumab-vedotin or a trial of erdafitinib if there is clinical or radiologic evidence of disease progression. She will call in the interim for any symptoms.  Betsy Coder, MD  06/07/2022  4:12 PM

## 2022-06-09 ENCOUNTER — Other Ambulatory Visit: Payer: Self-pay

## 2022-07-15 ENCOUNTER — Other Ambulatory Visit: Payer: Self-pay

## 2022-07-20 ENCOUNTER — Telehealth: Payer: Self-pay | Admitting: *Deleted

## 2022-07-20 NOTE — Telephone Encounter (Signed)
Called Ms. Iyengar to cancel her 4/10 f/u since PET is not until 4/22. She agrees. Scheduling message sent for OV few days after PET

## 2022-07-21 ENCOUNTER — Inpatient Hospital Stay: Payer: Medicare Other | Admitting: Oncology

## 2022-07-25 ENCOUNTER — Other Ambulatory Visit: Payer: Self-pay

## 2022-08-02 ENCOUNTER — Encounter (HOSPITAL_COMMUNITY)
Admission: RE | Admit: 2022-08-02 | Discharge: 2022-08-02 | Disposition: A | Discharge status: Home / Self Care | Payer: Medicare Other | Source: Ambulatory Visit | Attending: Oncology | Admitting: Oncology

## 2022-08-02 DIAGNOSIS — C679 Malignant neoplasm of bladder, unspecified: Secondary | ICD-10-CM | POA: Diagnosis not present

## 2022-08-02 LAB — GLUCOSE, CAPILLARY: Glucose-Capillary: 77 mg/dL (ref 70–99)

## 2022-08-02 MED ORDER — FLUDEOXYGLUCOSE F - 18 (FDG) INJECTION
9.2800 | Freq: Once | INTRAVENOUS | Status: AC
  Administered 2022-08-02: 9.28 via INTRAVENOUS

## 2022-08-06 ENCOUNTER — Telehealth: Payer: Self-pay

## 2022-08-06 ENCOUNTER — Other Ambulatory Visit: Payer: Self-pay

## 2022-08-06 ENCOUNTER — Encounter: Payer: Self-pay | Admitting: Oncology

## 2022-08-06 ENCOUNTER — Inpatient Hospital Stay: Payer: Medicare Other | Attending: Oncology | Admitting: Oncology

## 2022-08-06 VITALS — BP 145/70 | HR 97 | Temp 98.1°F | Resp 18 | Ht 64.0 in | Wt 185.9 lb

## 2022-08-06 DIAGNOSIS — C679 Malignant neoplasm of bladder, unspecified: Secondary | ICD-10-CM | POA: Diagnosis not present

## 2022-08-06 DIAGNOSIS — T451X5A Adverse effect of antineoplastic and immunosuppressive drugs, initial encounter: Secondary | ICD-10-CM | POA: Insufficient documentation

## 2022-08-06 DIAGNOSIS — N189 Chronic kidney disease, unspecified: Secondary | ICD-10-CM | POA: Insufficient documentation

## 2022-08-06 DIAGNOSIS — G620 Drug-induced polyneuropathy: Secondary | ICD-10-CM | POA: Insufficient documentation

## 2022-08-06 DIAGNOSIS — J45909 Unspecified asthma, uncomplicated: Secondary | ICD-10-CM | POA: Diagnosis not present

## 2022-08-06 DIAGNOSIS — C772 Secondary and unspecified malignant neoplasm of intra-abdominal lymph nodes: Secondary | ICD-10-CM | POA: Insufficient documentation

## 2022-08-06 DIAGNOSIS — Z95828 Presence of other vascular implants and grafts: Secondary | ICD-10-CM

## 2022-08-06 DIAGNOSIS — Z86718 Personal history of other venous thrombosis and embolism: Secondary | ICD-10-CM | POA: Diagnosis not present

## 2022-08-06 DIAGNOSIS — C787 Secondary malignant neoplasm of liver and intrahepatic bile duct: Secondary | ICD-10-CM | POA: Insufficient documentation

## 2022-08-06 DIAGNOSIS — C7889 Secondary malignant neoplasm of other digestive organs: Secondary | ICD-10-CM | POA: Diagnosis not present

## 2022-08-06 NOTE — Progress Notes (Signed)
DISCONTINUE ON PATHWAY REGIMEN - Bladder     A cycle is every 28 days:     Enfortumab vedotin-ejfv   **Always confirm dose/schedule in your pharmacy ordering system**  REASON: Disease Progression PRIOR TREATMENT: [id number]: Enfortumab vedotin 1.25 mg/kg D1, 8, 15 q28 Days Until Progression or Unacceptable Toxicity TREATMENT RESPONSE: Partial Response (PR)  START OFF PATHWAY REGIMEN - Bladder   [id number]:Enfortumab vedotin 1.25 mg/kg IV D1,8 + Pembrolizumab 200 mg IV D1 q21 Days:   A cycle is every 21 days:     Enfortumab vedotin-ejfv      Pembrolizumab   **Always confirm dose/schedule in your pharmacy ordering system**  Patient Characteristics: Advanced/Metastatic Disease, Third Line and Beyond Therapeutic Status: Advanced/Metastatic Disease Line of Therapy: Third Line and Beyond  Intent of Therapy: Non-Curative / Palliative Intent, Discussed with Patient

## 2022-08-06 NOTE — Progress Notes (Signed)
[facility] OFFICE PROGRESS NOTE   Diagnosis: Bladder cancer  INTERVAL HISTORY:   Alexandra Little returns as scheduled.  She reports feeling well.  Good appetite and energy level.  Stable numbness in the feet.  This does not interfere with activity.  The numbness is chiefly present when she is barefoot.  She has burning with urination and hematuria.  She reports chronic urinary tract infections that improved with antibiotics and shortly return.  Alexandra Little plans to relocate to [place] in early June.  Objective:  Vital signs in last 24 hours:  Blood pressure (!) 145/70, pulse 97, temperature 98.1 F (36.7 C), temperature source Oral, resp. rate 18, height 5\' 4"  (1.626 m), weight 185 lb 14.4 oz (84.3 kg), SpO2 100 %.    Lymphatics: No cervical, supraclavicular, axillary, or inguinal nodes Resp: Lungs clear bilaterally Cardio: Regular rate and rhythm GI: No hepatosplenomegaly Vascular: No leg edema   Portacath/PICC-without erythema  Lab Results:  Lab Results  Component Value Date   WBC 6.9 05/17/2022   HGB 12.9 05/17/2022   HCT 39.5 05/17/2022   MCV 89.6 05/17/2022   PLT 223 05/17/2022   NEUTROABS 4.5 05/17/2022    CMP  Lab Results  Component Value Date   NA 140 05/17/2022   K 4.2 05/17/2022   CL 106 05/17/2022   CO2 22 05/17/2022   GLUCOSE 110 (H) 05/17/2022   BUN 14 05/17/2022   CREATININE 2.00 (H) 05/17/2022   CALCIUM 9.6 05/17/2022   PROT 7.6 05/17/2022   ALBUMIN 4.2 05/17/2022   AST 48 (H) 05/17/2022   ALT 21 05/17/2022   ALKPHOS 66 05/17/2022   BILITOT 0.5 05/17/2022   GFRNONAA 25 (L) 05/17/2022     Medications: I have reviewed the patient's current medications.   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 Lymph node metastases on PET, bilateral ureteral obstruction requiring bilateral PCNs Admission to  [facility] 03/13/2019 - 03/16/2019 with gross hematuria requiring cystoscopy, clot evacuation, repeat TURBT, and fulguration Gemcitabine/carboplatin 04/03/2019 - 07/10/2019, 5 cycles complicated by significant anemia/thrombocytopenia requiring transfusion and treatment delays despite dose reductions CT 06/26/2019-positive response to chemotherapy Maintenance Avelumab 07/31/2019 CTs 07/31/2019-worsening pelvic, retroperitoneal, and inguinal lymphadenopathy-pseudo progression?,  Avelumab continued CTs 01/01/2020-progressive lymphadenopathy and diffuse bladder wall thickening with possible invasion of the uterus-avelumab discontinued Padcev 01/08/2020, day 15 canceled due to patient request with severe fatigue Padcev resumed at a dose of 1 mg/kg beginning 03/04/2020 CTs 04/15/2020-positive treatment response with decreased urinary bladder wall thickening and improved lymphadenopathy Relocated to [facility], treatment continued March 2022 and discontinued 07/17/2020 due to patient preference and evidence of a complete response CTs 08/21/2020-no evidence for metastatic disease CTs 11/30/2021-persistent hydronephrosis and hydroureter, urinary bladder wall thickening, borderline enlarged porta hepatis node, subtle hypodensity in the liver just above the gallbladder fossa PET 12/24/2021-3 separate foci of intense hypermetabolic activity at the liver, porta hepatis, and splenic hilum no evidence of metastatic disease in the chest, unchanged hydronephrosis and chronic bladder wall thickening Padcev 1 mg/kilogram on day 1/day 15 schedule beginning 02/01/2022, last given 05/04/2022 PET 04/16/2022-mixed response with resolution of a hepatic metastasis, new hypermetabolic portacaval node, similar hypermetabolic lesion at the splenic hilum, chronic bilateral hydronephrosis and bladder wall thickening, steatotic and possibly cirrhotic liver PET 08/02/2022-irregular anterior bladder wall thickening, moderate right and mild left  hydronephrosis, progressive hepatic, splenic, pancreatic, and upper abdominal nodal metastases  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  Lateral lower extremity DVTs 05/22/2019-treated with apixaban, no longer on anticoagulation therapy 4.  Asthma 5.  Chronic renal failure      Disposition: [name] has metastatic bladder cancer.  She is maintained off of systemic therapy since completing treatment with enfortumab on 05/04/2022.  I reviewed the restaging PET findings with her.  I reviewed the images with her husband (Alexandra Little did not wish to view the PET images).  There is radiologic evidence of disease progression.  We discussed treatment options.  We discussed the PET findings from 04/16/2022 when a mixed response was noted.  She feels the enfortumab helped her cancer and would like to return to enfortumab.  She would like to proceed with a combination of enfortumab and pembrolizumab.  She understands there was disease progression on avelumab and the specter response to combination therapy is smaller.  We reviewed potential toxicities associated with this regimen including the chance of progressive neuropathy, rash, diarrhea, and various autoimmune toxicities.  She agrees to proceed.  We also discussed treatment with erdafitinib given the FGFR3 mutation.  We reviewed potential toxicities associated with erdafitinib.  She is not comfortable proceeding with erdafitinib at present.  The plan is to resume treatment with encorafenib and pembrolizumab next week.  She will likely complete 2-3 cycles prior to relocating to [place].  A chemotherapy plan was entered today.  The enfortumab will be given once every 3 weeks due to pre-existing neuropathy.  Thornton Papas, MD  08/06/2022  11:42 AM

## 2022-08-06 NOTE — Telephone Encounter (Signed)
Attempted to reach pt to schedule lab appt. Will have scheduling department reach out on Monday (09/08/22)

## 2022-08-09 ENCOUNTER — Telehealth: Payer: Self-pay

## 2022-08-09 NOTE — Telephone Encounter (Signed)
Scheduled per 904/26 in-basket message, patient has been called and voicemail was left.

## 2022-08-10 ENCOUNTER — Other Ambulatory Visit: Payer: Self-pay

## 2022-08-12 ENCOUNTER — Telehealth: Payer: Self-pay | Admitting: Oncology

## 2022-08-12 ENCOUNTER — Other Ambulatory Visit: Payer: Self-pay

## 2022-08-13 ENCOUNTER — Ambulatory Visit: Payer: Medicare Other

## 2022-08-13 ENCOUNTER — Other Ambulatory Visit: Payer: Self-pay

## 2022-08-13 ENCOUNTER — Inpatient Hospital Stay: Payer: Medicare Other

## 2022-08-14 ENCOUNTER — Other Ambulatory Visit: Payer: Self-pay

## 2022-08-16 ENCOUNTER — Other Ambulatory Visit: Payer: Self-pay | Admitting: Oncology

## 2022-08-17 ENCOUNTER — Ambulatory Visit: Payer: Medicare Other

## 2022-08-17 ENCOUNTER — Inpatient Hospital Stay: Payer: Medicare Other

## 2022-08-17 ENCOUNTER — Inpatient Hospital Stay: Payer: Medicare Other | Attending: Oncology

## 2022-08-17 VITALS — BP 110/62 | HR 72 | Temp 98.3°F | Resp 18 | Ht 64.0 in | Wt 185.1 lb

## 2022-08-17 DIAGNOSIS — Z5112 Encounter for antineoplastic immunotherapy: Secondary | ICD-10-CM | POA: Insufficient documentation

## 2022-08-17 DIAGNOSIS — C787 Secondary malignant neoplasm of liver and intrahepatic bile duct: Secondary | ICD-10-CM | POA: Insufficient documentation

## 2022-08-17 DIAGNOSIS — C679 Malignant neoplasm of bladder, unspecified: Secondary | ICD-10-CM

## 2022-08-17 DIAGNOSIS — Z79899 Other long term (current) drug therapy: Secondary | ICD-10-CM | POA: Insufficient documentation

## 2022-08-17 DIAGNOSIS — C772 Secondary and unspecified malignant neoplasm of intra-abdominal lymph nodes: Secondary | ICD-10-CM | POA: Insufficient documentation

## 2022-08-17 DIAGNOSIS — C7889 Secondary malignant neoplasm of other digestive organs: Secondary | ICD-10-CM | POA: Diagnosis not present

## 2022-08-17 DIAGNOSIS — Z95828 Presence of other vascular implants and grafts: Secondary | ICD-10-CM

## 2022-08-17 LAB — CBC WITH DIFFERENTIAL (CANCER CENTER ONLY)
Abs Immature Granulocytes: 0.02 10*3/uL (ref 0.00–0.07)
Basophils Absolute: 0.1 10*3/uL (ref 0.0–0.1)
Basophils Relative: 1 %
Eosinophils Absolute: 0.4 10*3/uL (ref 0.0–0.5)
Eosinophils Relative: 6 %
HCT: 40.3 % (ref 36.0–46.0)
Hemoglobin: 13.1 g/dL (ref 12.0–15.0)
Immature Granulocytes: 0 %
Lymphocytes Relative: 14 %
Lymphs Abs: 1 10*3/uL (ref 0.7–4.0)
MCH: 30 pg (ref 26.0–34.0)
MCHC: 32.5 g/dL (ref 30.0–36.0)
MCV: 92.4 fL (ref 80.0–100.0)
Monocytes Absolute: 0.5 10*3/uL (ref 0.1–1.0)
Monocytes Relative: 7 %
Neutro Abs: 4.9 10*3/uL (ref 1.7–7.7)
Neutrophils Relative %: 72 %
Platelet Count: 178 10*3/uL (ref 150–400)
RBC: 4.36 MIL/uL (ref 3.87–5.11)
RDW: 13.4 % (ref 11.5–15.5)
WBC Count: 6.8 10*3/uL (ref 4.0–10.5)
nRBC: 0 % (ref 0.0–0.2)

## 2022-08-17 LAB — URINALYSIS, COMPLETE (UACMP) WITH MICROSCOPIC
Bilirubin Urine: NEGATIVE
Glucose, UA: NEGATIVE mg/dL
Ketones, ur: NEGATIVE mg/dL
Nitrite: POSITIVE — AB
Protein, ur: 30 mg/dL — AB
RBC / HPF: >50 RBC/hpf (ref 0–5)
Specific Gravity, Urine: 1.012 (ref 1.005–1.030)
WBC, UA: >50 WBC/hpf (ref 0–5)
pH: 6 (ref 5.0–8.0)

## 2022-08-17 LAB — CMP (CANCER CENTER ONLY)
ALT: 5 U/L (ref 0–44)
AST: 18 U/L (ref 15–41)
Albumin: 4.1 g/dL (ref 3.5–5.0)
Alkaline Phosphatase: 83 U/L (ref 38–126)
Anion gap: 9 (ref 5–15)
BUN: 36 mg/dL — ABNORMAL HIGH (ref 8–23)
CO2: 21 mmol/L — ABNORMAL LOW (ref 22–32)
Calcium: 9.5 mg/dL (ref 8.9–10.3)
Chloride: 107 mmol/L (ref 98–111)
Creatinine: 1.89 mg/dL — ABNORMAL HIGH (ref 0.44–1.00)
GFR, Estimated: 27 mL/min — ABNORMAL LOW (ref >60)
Glucose, Bld: 122 mg/dL — ABNORMAL HIGH (ref 70–99)
Potassium: 4.4 mmol/L (ref 3.5–5.1)
Sodium: 137 mmol/L (ref 135–145)
Total Bilirubin: 0.4 mg/dL (ref 0.3–1.2)
Total Protein: 7.9 g/dL (ref 6.5–8.1)

## 2022-08-17 LAB — TSH: TSH: 3.214 u[IU]/mL (ref 0.350–4.500)

## 2022-08-17 MED ORDER — SODIUM CHLORIDE 0.9 % IV SOLN
1.0000 mg/kg | Freq: Once | INTRAVENOUS | Status: AC
  Administered 2022-08-17: 80 mg via INTRAVENOUS
  Filled 2022-08-17: qty 2

## 2022-08-17 MED ORDER — SODIUM CHLORIDE 0.9 % IV SOLN: Freq: Once | INTRAVENOUS | Status: AC

## 2022-08-17 MED ORDER — SODIUM CHLORIDE 0.9 % IV SOLN
200.0000 mg | Freq: Once | INTRAVENOUS | Status: AC
  Administered 2022-08-17: 200 mg via INTRAVENOUS
  Filled 2022-08-17: qty 8

## 2022-08-17 MED ORDER — PROCHLORPERAZINE MALEATE 10 MG PO TABS
10.0000 mg | ORAL_TABLET | Freq: Once | ORAL | Status: AC
  Administered 2022-08-17: 10 mg via ORAL
  Filled 2022-08-17: qty 1

## 2022-08-17 NOTE — Progress Notes (Signed)
Per Dr. Truett Perna: OK to treat today w/creatinine 1.89. This was confirmed by pharmacy as well.

## 2022-08-17 NOTE — Patient Instructions (Signed)
[facility]   Discharge Instructions: Thank you for choosing [facility] to provide your oncology and hematology care.   If you have a lab appointment with the Cancer Center, please go directly to the Cancer Center and check in at the registration area.   Wear comfortable clothing and clothing appropriate for easy access to any Portacath or PICC line.   We strive to give you quality time with your provider. You may need to reschedule your appointment if you arrive late (15 or more minutes).  Arriving late affects you and other patients whose appointments are after yours.  Also, if you miss three or more appointments without notifying the office, you may be dismissed from the clinic at the provider's discretion.      For prescription refill requests, have your pharmacy contact our office and allow 72 hours for refills to be completed.    Today you received the following chemotherapy and/or immunotherapy agents Enfortumab vedotin-ejfv (PADCEV) & Pembrolizumab (KEYTRUDA).      To help prevent nausea and vomiting after your treatment, we encourage you to take your nausea medication as directed.  BELOW ARE SYMPTOMS THAT SHOULD BE REPORTED IMMEDIATELY: *FEVER GREATER THAN 100.4 F (38 C) OR HIGHER *CHILLS OR SWEATING *NAUSEA AND VOMITING THAT IS NOT CONTROLLED WITH YOUR NAUSEA MEDICATION *UNUSUAL SHORTNESS OF BREATH *UNUSUAL BRUISING OR BLEEDING *URINARY PROBLEMS (pain or burning when urinating, or frequent urination) *BOWEL PROBLEMS (unusual diarrhea, constipation, pain near the anus) TENDERNESS IN MOUTH AND THROAT WITH OR WITHOUT PRESENCE OF ULCERS (sore throat, sores in mouth, or a toothache) UNUSUAL RASH, SWELLING OR PAIN  UNUSUAL VAGINAL DISCHARGE OR ITCHING   Items with * indicate a potential emergency and should be followed up as soon as possible or go to the Emergency Department if any problems should occur.  Please show the  CHEMOTHERAPY ALERT CARD or IMMUNOTHERAPY ALERT CARD at check-in to the Emergency Department and triage nurse.  Should you have questions after your visit or need to cancel or reschedule your appointment, please contact [facility]  Dept: [phone]  and follow the prompts.  Office hours are 8:00 a.m. to 4:30 p.m. Monday - Friday. Please note that voicemails left after 4:00 p.m. may not be returned until the following business day.  We are closed weekends and major holidays. You have access to a nurse at all times for urgent questions. Please call the main number to the clinic Dept: [phone] and follow the prompts.   For any non-urgent questions, you may also contact your provider using MyChart. We now offer e-Visits for anyone 18 and older to request care online for non-urgent symptoms. For details visit [email or web address].   Also download the MyChart app! Go to the app store, search "MyChart", open the app, select [facility], and log in with your MyChart username and password.  Enfortumab Vedotin Injection What is this medication? ENFORTUMAB VEDOTIN (en FORT ue mab ve DOE tin) treats bladder cancer and kidney cancer. It works by blocking a protein that causes cancer cells to grow and multiply. This helps to slow or stop the spread of cancer cells. This medicine may be used for other purposes; ask your health care provider or pharmacist if you have questions. COMMON BRAND NAME(S): PADCEV What should I tell my care team before I take this medication? They need to know if you have any of these conditions: Diabetes Eye disease Liver disease Lung disease Tingling of   the fingers or toes or other nerve disorder Vision problems An unusual or allergic reaction to enfortumab vedotin, other medications, foods, dyes, or preservatives Pregnant or trying to get pregnant Breast-feeding How should I use this medication? This medication is injected into a  vein. It is given by your care team in a hospital or clinic setting. Talk to your care team about the use of this medication in children. Special care may be needed. Overdosage: If you think you have taken too much of this medicine contact a poison control center or emergency room at once. NOTE: This medicine is only for you. Do not share this medicine with others. What if I miss a dose? Keep appointments for follow-up doses. It is important not to miss your dose. Call your care team if you are unable to keep an appointment. What may interact with this medication? This medication may affect how other medications work, and other medications may affect how this medication works. Talk with your care team about all of the medications you take. They may suggest changes to your treatment plan to lower the risk of side effects and to make sure your medications work as intended. This list may not describe all possible interactions. Give your health care provider a list of all the medicines, herbs, non-prescription drugs, or dietary supplements you use. Also tell them if you smoke, drink alcohol, or use illegal drugs. Some items may interact with your medicine. What should I watch for while using this medication? Your condition will be monitored carefully while you are receiving this medication. This medication may make you feel generally unwell. This is not uncommon as chemotherapy can affect healthy cells as well as cancer cells. Report any side effects. Continue your course of treatment even though you feel ill unless your care team tells you to stop. This medication may increase blood sugar. The risk may be higher in patients who already have diabetes. Ask your care team what you can do to lower your risk of diabetes while taking this medication. This medication can cause a serious condition in which there is too much acid in your blood. If you develop nausea, vomiting, stomach pain, unusual tiredness, or  trouble breathing, stop taking this medication and call your care team right away. If possible, use a ketone dipstick to check for ketones in your urine. This medication may cause dry eyes and blurred vision. If you wear contact lenses, you may feel some discomfort. Lubricating eye drops may help. See your care team if the problem does not go away or is severe. Tell your care team right away if you have any change in your eyesight. This medication may increase your risk of getting an infection. Call your care team for advice if you get a fever, chills, sore throat, or other symptoms of a cold or flu. Do not treat yourself. Try to avoid being around people who are sick. Avoid taking medications that contain aspirin, acetaminophen, ibuprofen, naproxen, or ketoprofen unless instructed by your care team. These medications may hide a fever. This medication may cause serious skin reactions. They can happen weeks to months after starting the medication. Contact your care team right away if you notice fevers or flu-like symptoms with a rash. The rash may be red or purple and then turn into blisters or peeling of the skin. You may also notice a red rash with swelling of the face, lips or lymph nodes in your neck or under your arms. Talk to your care   team if you or your partner may be pregnant. Serious birth defects can occur if you take this medication during pregnancy and for 2 months after the last dose. You will need a negative pregnancy test before starting this medication. Contraception is recommended while taking this medication and for 2 months after the last dose. Your care team can help you find the option that works for you. If your partner can get pregnant, use a condom during sex while taking this medication and for 4 months after the last dose. Do not breastfeed while taking this medicine or for at least 3 weeks after the last dose. This medication may cause infertility. Talk to your care team if you  are concerned about your fertility. What side effects may I notice from receiving this medication? Side effects that you should report to your care team as soon as possible: Allergic reactions--skin rash, itching, hives, swelling of the face, lips, tongue, or throat Dry cough, shortness of breath or trouble breathing Eye pain, redness, irritation, or discharge with blurry or decreased vision High blood sugar (hyperglycemia)--increased thirst or amount of urine, unusual weakness or fatigue, blurry vision Painful swelling, warmth, or redness of the skin, blisters or sores at the infusion site Pain, tingling, or numbness in the hands or feet Redness, blistering, peeling, or loosening of the skin, including inside the mouth Unusual bruising or bleeding Side effects that usually do not require medical attention (report these to your care team if they continue or are bothersome): Change in taste Diarrhea Dry eyes Fatigue Hair loss Loss of appetite This list may not describe all possible side effects. Call your doctor for medical advice about side effects. You may report side effects to FDA at 1-800-FDA-1088. Where should I keep my medication? This medication is given in a hospital or clinic. It will not be stored at home. NOTE: This sheet is a summary. It may not cover all possible information. If you have questions about this medicine, talk to your doctor, pharmacist, or health care provider.  2023 Elsevier/Gold Standard (2021-08-11 00:00:00)   Pembrolizumab Injection What is this medication? PEMBROLIZUMAB (PEM broe LIZ ue mab) treats some types of cancer. It works by helping your immune system slow or stop the spread of cancer cells. It is a monoclonal antibody. This medicine may be used for other purposes; ask your health care provider or pharmacist if you have questions. COMMON BRAND NAME(S): Keytruda What should I tell my care team before I take this medication? They need to know if  you have any of these conditions: Allogeneic stem cell transplant (uses someone else's stem cells) Autoimmune diseases, such as Crohn disease, ulcerative colitis, lupus History of chest radiation Nervous system problems, such as Guillain-Barre syndrome, myasthenia gravis Organ transplant An unusual or allergic reaction to pembrolizumab, other medications, foods, dyes, or preservatives Pregnant or trying to get pregnant Breast-feeding How should I use this medication? This medication is injected into a vein. It is given by your care team in a hospital or clinic setting. A special MedGuide will be given to you before each treatment. Be sure to read this information carefully each time. Talk to your care team about the use of this medication in children. While it may be prescribed for children as young as 6 months for selected conditions, precautions do apply. Overdosage: If you think you have taken too much of this medicine contact a poison control center or emergency room at once. NOTE: This medicine is only for you. Do   not share this medicine with others. What if I miss a dose? Keep appointments for follow-up doses. It is important not to miss your dose. Call your care team if you are unable to keep an appointment. What may interact with this medication? Interactions have not been studied. This list may not describe all possible interactions. Give your health care provider a list of all the medicines, herbs, non-prescription drugs, or dietary supplements you use. Also tell them if you smoke, drink alcohol, or use illegal drugs. Some items may interact with your medicine. What should I watch for while using this medication? Your condition will be monitored carefully while you are receiving this medication. You may need blood work while taking this medication. This medication may cause serious skin reactions. They can happen weeks to months after starting the medication. Contact your care team  right away if you notice fevers or flu-like symptoms with a rash. The rash may be red or purple and then turn into blisters or peeling of the skin. You may also notice a red rash with swelling of the face, lips, or lymph nodes in your neck or under your arms. Tell your care team right away if you have any change in your eyesight. Talk to your care team if you may be pregnant. Serious birth defects can occur if you take this medication during pregnancy and for 4 months after the last dose. You will need a negative pregnancy test before starting this medication. Contraception is recommended while taking this medication and for 4 months after the last dose. Your care team can help you find the option that works for you. Do not breastfeed while taking this medication and for 4 months after the last dose. What side effects may I notice from receiving this medication? Side effects that you should report to your care team as soon as possible: Allergic reactions--skin rash, itching, hives, swelling of the face, lips, tongue, or throat Dry cough, shortness of breath or trouble breathing Eye pain, redness, irritation, or discharge with blurry or decreased vision Heart muscle inflammation--unusual weakness or fatigue, shortness of breath, chest pain, fast or irregular heartbeat, dizziness, swelling of the ankles, feet, or hands Hormone gland problems--headache, sensitivity to light, unusual weakness or fatigue, dizziness, fast or irregular heartbeat, increased sensitivity to cold or heat, excessive sweating, constipation, hair loss, increased thirst or amount of urine, tremors or shaking, irritability Infusion reactions--chest pain, shortness of breath or trouble breathing, feeling faint or lightheaded Kidney injury (glomerulonephritis)--decrease in the amount of urine, red or dark brown urine, foamy or bubbly urine, swelling of the ankles, hands, or feet Liver injury--right upper belly pain, loss of appetite,  nausea, light-colored stool, dark yellow or brown urine, yellowing skin or eyes, unusual weakness or fatigue Pain, tingling, or numbness in the hands or feet, muscle weakness, change in vision, confusion or trouble speaking, loss of balance or coordination, trouble walking, seizures Rash, fever, and swollen lymph nodes Redness, blistering, peeling, or loosening of the skin, including inside the mouth Sudden or severe stomach pain, bloody diarrhea, fever, nausea, vomiting Side effects that usually do not require medical attention (report to your care team if they continue or are bothersome): Bone, joint, or muscle pain Diarrhea Fatigue Loss of appetite Nausea Skin rash This list may not describe all possible side effects. Call your doctor for medical advice about side effects. You may report side effects to FDA at 1-800-FDA-1088. Where should I keep my medication? This medication is given in a hospital   or clinic. It will not be stored at home. NOTE: This sheet is a summary. It may not cover all possible information. If you have questions about this medicine, talk to your doctor, pharmacist, or health care provider.  2023 Elsevier/Gold Standard (2021-08-11 00:00:00)  

## 2022-08-18 ENCOUNTER — Telehealth: Payer: Self-pay | Admitting: Emergency Medicine

## 2022-08-18 LAB — URINE CULTURE

## 2022-08-18 NOTE — Telephone Encounter (Signed)
24 Hour Callback 24 Hour callback post 1st time Keytruda infusion.  There was no answer, left voicemail message instructing the patient to callback with any concerns or questions.

## 2022-08-19 ENCOUNTER — Telehealth: Payer: Self-pay | Admitting: *Deleted

## 2022-08-19 LAB — URINE CULTURE: Culture: >=100000 — AB

## 2022-08-19 LAB — T4: T4, Total: 8.1 ug/dL (ref 4.5–12.0)

## 2022-08-19 MED ORDER — SULFAMETHOXAZOLE-TRIMETHOPRIM 800-160 MG PO TABS: 1.0000 | ORAL_TABLET | Freq: Two times a day (BID) | ORAL | 0 refills | Status: DC

## 2022-08-19 NOTE — Telephone Encounter (Signed)
Urine culture shows + for klebsiella pneumoniae. Per Dr. Truett Perna: Start Bactrim DS #1 bid x 7 days.  Routed results to PCP as well. Left message for patient to call office to discuss.

## 2022-08-19 NOTE — Telephone Encounter (Signed)
Patient aware of results and agrees to Bactrim therapy.

## 2022-08-26 ENCOUNTER — Other Ambulatory Visit: Payer: Self-pay

## 2022-09-06 ENCOUNTER — Other Ambulatory Visit: Payer: Self-pay | Admitting: Oncology

## 2022-09-07 ENCOUNTER — Inpatient Hospital Stay (HOSPITAL_BASED_OUTPATIENT_CLINIC_OR_DEPARTMENT_OTHER): Payer: Medicare Other | Admitting: Oncology

## 2022-09-07 ENCOUNTER — Inpatient Hospital Stay: Payer: Medicare Other

## 2022-09-07 ENCOUNTER — Encounter: Payer: Self-pay | Admitting: *Deleted

## 2022-09-07 VITALS — BP 111/47 | HR 74

## 2022-09-07 VITALS — BP 123/76 | HR 92 | Temp 98.1°F | Resp 18 | Ht 64.0 in | Wt 184.0 lb

## 2022-09-07 DIAGNOSIS — Z5112 Encounter for antineoplastic immunotherapy: Secondary | ICD-10-CM | POA: Diagnosis not present

## 2022-09-07 DIAGNOSIS — C679 Malignant neoplasm of bladder, unspecified: Secondary | ICD-10-CM | POA: Diagnosis not present

## 2022-09-07 DIAGNOSIS — C787 Secondary malignant neoplasm of liver and intrahepatic bile duct: Secondary | ICD-10-CM | POA: Diagnosis not present

## 2022-09-07 DIAGNOSIS — C772 Secondary and unspecified malignant neoplasm of intra-abdominal lymph nodes: Secondary | ICD-10-CM | POA: Diagnosis not present

## 2022-09-07 DIAGNOSIS — C7889 Secondary malignant neoplasm of other digestive organs: Secondary | ICD-10-CM | POA: Diagnosis not present

## 2022-09-07 DIAGNOSIS — Z79899 Other long term (current) drug therapy: Secondary | ICD-10-CM | POA: Diagnosis not present

## 2022-09-07 LAB — CBC WITH DIFFERENTIAL (CANCER CENTER ONLY)
Abs Immature Granulocytes: 0.02 10*3/uL (ref 0.00–0.07)
Basophils Absolute: 0 10*3/uL (ref 0.0–0.1)
Basophils Relative: 1 %
Eosinophils Absolute: 0.2 10*3/uL (ref 0.0–0.5)
Eosinophils Relative: 6 %
HCT: 36.6 % (ref 36.0–46.0)
Hemoglobin: 12 g/dL (ref 12.0–15.0)
Immature Granulocytes: 1 %
Lymphocytes Relative: 14 %
Lymphs Abs: 0.6 10*3/uL — ABNORMAL LOW (ref 0.7–4.0)
MCH: 29.5 pg (ref 26.0–34.0)
MCHC: 32.8 g/dL (ref 30.0–36.0)
MCV: 89.9 fL (ref 80.0–100.0)
Monocytes Absolute: 0.4 10*3/uL (ref 0.1–1.0)
Monocytes Relative: 9 %
Neutro Abs: 2.9 10*3/uL (ref 1.7–7.7)
Neutrophils Relative %: 69 %
Platelet Count: 132 10*3/uL — ABNORMAL LOW (ref 150–400)
RBC: 4.07 MIL/uL (ref 3.87–5.11)
RDW: 14.8 % (ref 11.5–15.5)
WBC Count: 4.1 10*3/uL (ref 4.0–10.5)
nRBC: 0 % (ref 0.0–0.2)

## 2022-09-07 LAB — CMP (CANCER CENTER ONLY)
ALT: 10 U/L (ref 0–44)
AST: 30 U/L (ref 15–41)
Albumin: 3.9 g/dL (ref 3.5–5.0)
Alkaline Phosphatase: 95 U/L (ref 38–126)
Anion gap: 9 (ref 5–15)
BUN: 39 mg/dL — ABNORMAL HIGH (ref 8–23)
CO2: 20 mmol/L — ABNORMAL LOW (ref 22–32)
Calcium: 9.4 mg/dL (ref 8.9–10.3)
Chloride: 109 mmol/L (ref 98–111)
Creatinine: 1.93 mg/dL — ABNORMAL HIGH (ref 0.44–1.00)
GFR, Estimated: 26 mL/min — ABNORMAL LOW (ref >60)
Glucose, Bld: 130 mg/dL — ABNORMAL HIGH (ref 70–99)
Potassium: 4.5 mmol/L (ref 3.5–5.1)
Sodium: 138 mmol/L (ref 135–145)
Total Bilirubin: 0.5 mg/dL (ref 0.3–1.2)
Total Protein: 7.4 g/dL (ref 6.5–8.1)

## 2022-09-07 MED ORDER — HYDROXYZINE HCL 25 MG PO TABS: 25.0000 mg | ORAL_TABLET | Freq: Three times a day (TID) | ORAL | 1 refills | Status: AC | PRN

## 2022-09-07 MED ORDER — PROCHLORPERAZINE MALEATE 10 MG PO TABS
10.0000 mg | ORAL_TABLET | Freq: Once | ORAL | Status: AC
  Administered 2022-09-07: 10 mg via ORAL
  Filled 2022-09-07: qty 1

## 2022-09-07 MED ORDER — SODIUM CHLORIDE 0.9 % IV SOLN
1.0000 mg/kg | Freq: Once | INTRAVENOUS | Status: AC
  Administered 2022-09-07: 80 mg via INTRAVENOUS
  Filled 2022-09-07: qty 6

## 2022-09-07 MED ORDER — SODIUM CHLORIDE 0.9 % IV SOLN: Freq: Once | INTRAVENOUS | Status: AC

## 2022-09-07 NOTE — Progress Notes (Signed)
[facility] OFFICE PROGRESS NOTE   Diagnosis: Urothelial carcinoma  INTERVAL HISTORY:   Alexandra Little completed a cycle of Enfortumab and pembrolizumab on 08/17/2022.  She reports feeling well until 09/02/2022 when she noted the onset of diffuse arthralgias, chiefly in the hands, knees, and feet.  She had discomfort at the right ankle.  She also developed malaise.  No erythema or joint swelling.  Her hands were stiff.  She reports a low-grade fever last week.  No diarrhea or rash.  No change in baseline neuropathy symptoms in the feet.  No eye symptoms.  Objective:  Vital signs in last 24 hours:  Blood pressure 123/76, pulse 92, temperature 98.1 F (36.7 C), temperature source Oral, resp. rate 18, height 5\' 4"  (1.626 m), weight 184 lb (83.5 kg), SpO2 100 %.    HEENT: No thrush or ulcers Resp: Lungs clear bilaterally Cardio: Regular rate and rhythm GI: No hepatosplenomegaly, nontender Vascular: No leg edema Musculoskeletal: Mild tenderness at the PIP joints, no joint erythema or swelling. Skin: No rash  Portacath/PICC-without erythema  Lab Results:  Lab Results  Component Value Date   WBC 4.1 09/07/2022   HGB 12.0 09/07/2022   HCT 36.6 09/07/2022   MCV 89.9 09/07/2022   PLT 132 (L) 09/07/2022   NEUTROABS 2.9 09/07/2022    CMP  Lab Results  Component Value Date   NA 138 09/07/2022   K 4.5 09/07/2022   CL 109 09/07/2022   CO2 20 (L) 09/07/2022   GLUCOSE 130 (H) 09/07/2022   BUN 39 (H) 09/07/2022   CREATININE 1.93 (H) 09/07/2022   CALCIUM 9.4 09/07/2022   PROT 7.4 09/07/2022   ALBUMIN 3.9 09/07/2022   AST 30 09/07/2022   ALT 10 09/07/2022   ALKPHOS 95 09/07/2022   BILITOT 0.5 09/07/2022   GFRNONAA 26 (L) 09/07/2022    Medications: I have reviewed the patient's current medications.   Assessment/Plan: Bladder cancer, stage IV, FGFR3 mutation, MSS, tumor mutation burden 2.7 on [employer] testing 01/24/2021-TURBT diagnosed with muscle invasive bladder  cancer Lymph node metastases on PET, bilateral ureteral obstruction requiring bilateral PCNs Admission to [facility] hospital 03/13/2019 - 03/16/2019 with gross hematuria requiring cystoscopy, clot evacuation, repeat TURBT, and fulguration Gemcitabine/carboplatin 04/03/2019 - 07/10/2019, 5 cycles complicated by significant anemia/thrombocytopenia requiring transfusion and treatment delays despite dose reductions CT 06/26/2019-positive response to chemotherapy Maintenance Avelumab 07/31/2019 CTs 07/31/2019-worsening pelvic, retroperitoneal, and inguinal lymphadenopathy-pseudo progression?,  Avelumab continued CTs 01/01/2020-progressive lymphadenopathy and diffuse bladder wall thickening with possible invasion of the uterus-avelumab discontinued Padcev 01/08/2020, day 15 canceled due to patient request with severe fatigue Padcev resumed at a dose of 1 mg/kg beginning 03/04/2020 CTs 04/15/2020-positive treatment response with decreased urinary bladder wall thickening and improved lymphadenopathy Relocated to [facility], treatment continued March 2022 and discontinued 07/17/2020 due to patient preference and evidence of a complete response CTs 08/21/2020-no evidence for metastatic disease CTs 11/30/2021-persistent hydronephrosis and hydroureter, urinary bladder wall thickening, borderline enlarged porta hepatis node, subtle hypodensity in the liver just above the gallbladder fossa PET 12/24/2021-3 separate foci of intense hypermetabolic activity at the liver, porta hepatis, and splenic hilum no evidence of metastatic disease in the chest, unchanged hydronephrosis and chronic bladder wall thickening Padcev 1 mg/kilogram on day 1/day 15 schedule beginning 02/01/2022, last given 05/04/2022 PET 04/16/2022-mixed response with resolution of a hepatic metastasis, new hypermetabolic portacaval node, similar hypermetabolic lesion at the splenic hilum, chronic bilateral hydronephrosis and bladder wall thickening, steatotic and possibly  cirrhotic liver PET 08/02/2022-irregular anterior bladder wall thickening,  moderate right and mild left hydronephrosis, progressive hepatic, splenic, pancreatic, and upper abdominal nodal metastases 08/17/2022 cycle 1 enfortumab/pembrolizumab  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  Asthma 5.  Chronic renal failure       Disposition: Alexandra Little has metastatic bladder cancer.  She completed 1 cycle of salvage therapy with enfortumab/pembrolizumab on 08/17/2022.  No change in neuropathy symptoms.  She complains of diffuse arthralgias lays beginning 2-3 weeks following treatment.  The etiology of her symptoms is unclear.  It is possible she has developed arthritic symptoms related to pembrolizumab.  I doubt the symptoms are related to the enfortumab.  We discussed holding treatment versus proceeding with enfortumab alone.  She prefers proceeding with enfortumab.  She will complete a treatment today.  She plans to return to [place] next week and will see her physician there.  She will return for an office visit in another cycle of systemic therapy in 3 weeks.  She will call in the interim for new symptoms.  Thornton Papas, MD  09/07/2022  12:02 PM

## 2022-09-07 NOTE — Progress Notes (Signed)
Patient seen by Dr. Truett Perna today  Vitals are within treatment parameters.  Labs reviewed by Dr. Truett Perna and are not all within treatment parameters. Creatinine 1.93--OK to treat  Per physician team, patient is ready for treatment. Please note that modifications are being made to the treatment plan including D/C Keytruda from plan due to joint pain

## 2022-09-07 NOTE — Patient Instructions (Signed)
[facility] [address]   Discharge Instructions: Thank you for choosing [facility] to provide your oncology and hematology care.   If you have a lab appointment with the Cancer Center, please go directly to the Cancer Center and check in at the registration area.   Wear comfortable clothing and clothing appropriate for easy access to any Portacath or PICC line.   We strive to give you quality time with your provider. You may need to reschedule your appointment if you arrive late (15 or more minutes).  Arriving late affects you and other patients whose appointments are after yours.  Also, if you miss three or more appointments without notifying the office, you may be dismissed from the clinic at the provider's discretion.      For prescription refill requests, have your pharmacy contact our office and allow 72 hours for refills to be completed.    Today you received the following chemotherapy and/or immunotherapy agents Padcev       To help prevent nausea and vomiting after your treatment, we encourage you to take your nausea medication as directed.  BELOW ARE SYMPTOMS THAT SHOULD BE REPORTED IMMEDIATELY: *FEVER GREATER THAN 100.4 F (38 C) OR HIGHER *CHILLS OR SWEATING *NAUSEA AND VOMITING THAT IS NOT CONTROLLED WITH YOUR NAUSEA MEDICATION *UNUSUAL SHORTNESS OF BREATH *UNUSUAL BRUISING OR BLEEDING *URINARY PROBLEMS (pain or burning when urinating, or frequent urination) *BOWEL PROBLEMS (unusual diarrhea, constipation, pain near the anus) TENDERNESS IN MOUTH AND THROAT WITH OR WITHOUT PRESENCE OF ULCERS (sore throat, sores in mouth, or a toothache) UNUSUAL RASH, SWELLING OR PAIN  UNUSUAL VAGINAL DISCHARGE OR ITCHING   Items with * indicate a potential emergency and should be followed up as soon as possible or go to the Emergency Department if any problems should occur.  Please show the CHEMOTHERAPY ALERT CARD or IMMUNOTHERAPY ALERT CARD at  check-in to the Emergency Department and triage nurse.  Should you have questions after your visit or need to cancel or reschedule your appointment, please contact [facility]  Dept: [phone]  and follow the prompts.  Office hours are 8:00 a.m. to 4:30 p.m. Monday - Friday. Please note that voicemails left after 4:00 p.m. may not be returned until the following business day.  We are closed weekends and major holidays. You have access to a nurse at all times for urgent questions. Please call the main number to the clinic Dept: [phone] and follow the prompts.   For any non-urgent questions, you may also contact your provider using MyChart. We now offer e-Visits for anyone 93 and older to request care online for non-urgent symptoms. For details visit [email or web address].   Also download the MyChart app! Go to the app store, search "MyChart", open the app, select [place], and log in with your MyChart username and password.  Enfortumab Vedotin Injection What is this medication? ENFORTUMAB VEDOTIN (en FORT ue mab ve DOE tin) treats bladder cancer and kidney cancer. It works by blocking a protein that causes cancer cells to grow and multiply. This helps to slow or stop the spread of cancer cells. This medicine may be used for other purposes; ask your health care provider or pharmacist if you have questions. COMMON BRAND NAME(S): PADCEV What should I tell my care team before I take this medication? They need to know if you have any of these conditions: Diabetes Eye disease Liver disease Lung disease Tingling of the fingers or toes  or other nerve disorder Vision problems An unusual or allergic reaction to enfortumab vedotin, other medications, foods, dyes, or preservatives Pregnant or trying to get pregnant Breast-feeding How should I use this medication? This medication is injected into a vein. It is given by your care team in a hospital or clinic  setting. Talk to your care team about the use of this medication in children. Special care may be needed. Overdosage: If you think you have taken too much of this medicine contact a poison control center or emergency room at once. NOTE: This medicine is only for you. Do not share this medicine with others. What if I miss a dose? Keep appointments for follow-up doses. It is important not to miss your dose. Call your care team if you are unable to keep an appointment. What may interact with this medication? This medication may affect how other medications work, and other medications may affect how this medication works. Talk with your care team about all of the medications you take. They may suggest changes to your treatment plan to lower the risk of side effects and to make sure your medications work as intended. This list may not describe all possible interactions. Give your health care provider a list of all the medicines, herbs, non-prescription drugs, or dietary supplements you use. Also tell them if you smoke, drink alcohol, or use illegal drugs. Some items may interact with your medicine. What should I watch for while using this medication? Your condition will be monitored carefully while you are receiving this medication. This medication may make you feel generally unwell. This is not uncommon as chemotherapy can affect healthy cells as well as cancer cells. Report any side effects. Continue your course of treatment even though you feel ill unless your care team tells you to stop. This medication may increase blood sugar. The risk may be higher in patients who already have diabetes. Ask your care team what you can do to lower your risk of diabetes while taking this medication. This medication can cause a serious condition in which there is too much acid in your blood. If you develop nausea, vomiting, stomach pain, unusual tiredness, or trouble breathing, stop taking this medication and call your  care team right away. If possible, use a ketone dipstick to check for ketones in your urine. This medication may cause dry eyes and blurred vision. If you wear contact lenses, you may feel some discomfort. Lubricating eye drops may help. See your care team if the problem does not go away or is severe. Tell your care team right away if you have any change in your eyesight. This medication may increase your risk of getting an infection. Call your care team for advice if you get a fever, chills, sore throat, or other symptoms of a cold or flu. Do not treat yourself. Try to avoid being around people who are sick. Avoid taking medications that contain aspirin, acetaminophen, ibuprofen, naproxen, or ketoprofen unless instructed by your care team. These medications may hide a fever. This medication may cause serious skin reactions. They can happen weeks to months after starting the medication. Contact your care team right away if you notice fevers or flu-like symptoms with a rash. The rash may be red or purple and then turn into blisters or peeling of the skin. You may also notice a red rash with swelling of the face, lips or lymph nodes in your neck or under your arms. Talk to your care team if you or  your partner may be pregnant. Serious birth defects can occur if you take this medication during pregnancy and for 2 months after the last dose. You will need a negative pregnancy test before starting this medication. Contraception is recommended while taking this medication and for 2 months after the last dose. Your care team can help you find the option that works for you. If your partner can get pregnant, use a condom during sex while taking this medication and for 4 months after the last dose. Do not breastfeed while taking this medicine or for at least 3 weeks after the last dose. This medication may cause infertility. Talk to your care team if you are concerned about your fertility. What side effects may I  notice from receiving this medication? Side effects that you should report to your care team as soon as possible: Allergic reactions--skin rash, itching, hives, swelling of the face, lips, tongue, or throat Dry cough, shortness of breath or trouble breathing Eye pain, redness, irritation, or discharge with blurry or decreased vision High blood sugar (hyperglycemia)--increased thirst or amount of urine, unusual weakness or fatigue, blurry vision Painful swelling, warmth, or redness of the skin, blisters or sores at the infusion site Pain, tingling, or numbness in the hands or feet Redness, blistering, peeling, or loosening of the skin, including inside the mouth Unusual bruising or bleeding Side effects that usually do not require medical attention (report these to your care team if they continue or are bothersome): Change in taste Diarrhea Dry eyes Fatigue Hair loss Loss of appetite This list may not describe all possible side effects. Call your doctor for medical advice about side effects. You may report side effects to FDA at 1-800-FDA-1088. Where should I keep my medication? This medication is given in a hospital or clinic. It will not be stored at home. NOTE: This sheet is a summary. It may not cover all possible information. If you have questions about this medicine, talk to your doctor, pharmacist, or health care provider.  2024 [employer]/Gold Standard (2021-08-11 00:00:00)

## 2022-09-08 ENCOUNTER — Encounter: Payer: Self-pay | Admitting: Oncology

## 2022-09-09 ENCOUNTER — Other Ambulatory Visit: Payer: Self-pay

## 2022-09-13 ENCOUNTER — Other Ambulatory Visit: Payer: Self-pay | Admitting: *Deleted

## 2022-09-13 DIAGNOSIS — C679 Malignant neoplasm of bladder, unspecified: Secondary | ICD-10-CM

## 2022-09-13 NOTE — Progress Notes (Signed)
Faxed referral and chart information to Dr. Versie Starks at [facility] at patient's request since her current oncologist, Dr. Einar Pheasant is leaving practice in mid June.  Fax # [phone] NOtified imaging to push over images of 08/02/22 PET or mail CD.

## 2022-09-16 DIAGNOSIS — C679 Malignant neoplasm of bladder, unspecified: Secondary | ICD-10-CM | POA: Diagnosis not present

## 2022-09-16 DIAGNOSIS — C772 Secondary and unspecified malignant neoplasm of intra-abdominal lymph nodes: Secondary | ICD-10-CM | POA: Diagnosis not present

## 2022-09-24 DIAGNOSIS — Z5112 Encounter for antineoplastic immunotherapy: Secondary | ICD-10-CM | POA: Diagnosis not present

## 2022-09-24 DIAGNOSIS — C689 Malignant neoplasm of urinary organ, unspecified: Secondary | ICD-10-CM | POA: Diagnosis not present

## 2022-09-24 DIAGNOSIS — C679 Malignant neoplasm of bladder, unspecified: Secondary | ICD-10-CM | POA: Diagnosis not present

## 2022-09-24 DIAGNOSIS — C772 Secondary and unspecified malignant neoplasm of intra-abdominal lymph nodes: Secondary | ICD-10-CM | POA: Diagnosis not present

## 2022-09-26 ENCOUNTER — Other Ambulatory Visit: Payer: Self-pay | Admitting: Oncology

## 2022-09-30 ENCOUNTER — Inpatient Hospital Stay: Payer: Medicare Other

## 2022-09-30 ENCOUNTER — Inpatient Hospital Stay: Payer: Medicare Other | Admitting: Nurse Practitioner

## 2022-10-08 DIAGNOSIS — C689 Malignant neoplasm of urinary organ, unspecified: Secondary | ICD-10-CM | POA: Diagnosis not present

## 2022-10-08 DIAGNOSIS — C679 Malignant neoplasm of bladder, unspecified: Secondary | ICD-10-CM | POA: Diagnosis not present

## 2022-10-08 DIAGNOSIS — C772 Secondary and unspecified malignant neoplasm of intra-abdominal lymph nodes: Secondary | ICD-10-CM | POA: Diagnosis not present

## 2022-10-08 DIAGNOSIS — Z5112 Encounter for antineoplastic immunotherapy: Secondary | ICD-10-CM | POA: Diagnosis not present

## 2022-10-11 ENCOUNTER — Other Ambulatory Visit: Payer: Self-pay

## 2022-10-22 DIAGNOSIS — C679 Malignant neoplasm of bladder, unspecified: Secondary | ICD-10-CM | POA: Diagnosis not present

## 2022-10-22 DIAGNOSIS — C772 Secondary and unspecified malignant neoplasm of intra-abdominal lymph nodes: Secondary | ICD-10-CM | POA: Diagnosis not present

## 2022-10-22 DIAGNOSIS — Z79899 Other long term (current) drug therapy: Secondary | ICD-10-CM | POA: Diagnosis not present

## 2022-10-23 ENCOUNTER — Other Ambulatory Visit: Payer: Self-pay

## 2022-11-10 DIAGNOSIS — H25813 Combined forms of age-related cataract, bilateral: Secondary | ICD-10-CM | POA: Diagnosis not present

## 2022-11-11 DIAGNOSIS — C678 Malignant neoplasm of overlapping sites of bladder: Secondary | ICD-10-CM | POA: Diagnosis not present

## 2022-11-11 DIAGNOSIS — R0600 Dyspnea, unspecified: Secondary | ICD-10-CM | POA: Diagnosis not present

## 2022-11-11 DIAGNOSIS — D62 Acute posthemorrhagic anemia: Secondary | ICD-10-CM | POA: Diagnosis not present

## 2022-11-11 DIAGNOSIS — G620 Drug-induced polyneuropathy: Secondary | ICD-10-CM | POA: Diagnosis not present

## 2022-11-11 DIAGNOSIS — N1832 Chronic kidney disease, stage 3b: Secondary | ICD-10-CM | POA: Diagnosis not present

## 2022-11-18 DIAGNOSIS — H2513 Age-related nuclear cataract, bilateral: Secondary | ICD-10-CM | POA: Diagnosis not present

## 2022-11-25 DIAGNOSIS — C678 Malignant neoplasm of overlapping sites of bladder: Secondary | ICD-10-CM | POA: Diagnosis not present

## 2022-12-02 DIAGNOSIS — C678 Malignant neoplasm of overlapping sites of bladder: Secondary | ICD-10-CM | POA: Diagnosis not present

## 2022-12-02 DIAGNOSIS — C787 Secondary malignant neoplasm of liver and intrahepatic bile duct: Secondary | ICD-10-CM | POA: Diagnosis not present

## 2022-12-31 DIAGNOSIS — R161 Splenomegaly, not elsewhere classified: Secondary | ICD-10-CM | POA: Diagnosis not present

## 2022-12-31 DIAGNOSIS — D7389 Other diseases of spleen: Secondary | ICD-10-CM | POA: Diagnosis not present

## 2022-12-31 DIAGNOSIS — Z7901 Long term (current) use of anticoagulants: Secondary | ICD-10-CM | POA: Diagnosis not present

## 2022-12-31 DIAGNOSIS — C7989 Secondary malignant neoplasm of other specified sites: Secondary | ICD-10-CM | POA: Diagnosis not present

## 2022-12-31 DIAGNOSIS — C678 Malignant neoplasm of overlapping sites of bladder: Secondary | ICD-10-CM | POA: Diagnosis not present

## 2022-12-31 DIAGNOSIS — Z8551 Personal history of malignant neoplasm of bladder: Secondary | ICD-10-CM | POA: Diagnosis not present

## 2023-01-06 DIAGNOSIS — C678 Malignant neoplasm of overlapping sites of bladder: Secondary | ICD-10-CM | POA: Diagnosis not present

## 2023-01-06 DIAGNOSIS — C787 Secondary malignant neoplasm of liver and intrahepatic bile duct: Secondary | ICD-10-CM | POA: Diagnosis not present

## 2023-01-06 DIAGNOSIS — N1832 Chronic kidney disease, stage 3b: Secondary | ICD-10-CM | POA: Diagnosis not present

## 2023-01-10 DIAGNOSIS — C7989 Secondary malignant neoplasm of other specified sites: Secondary | ICD-10-CM | POA: Diagnosis not present

## 2023-01-10 DIAGNOSIS — C678 Malignant neoplasm of overlapping sites of bladder: Secondary | ICD-10-CM | POA: Diagnosis not present

## 2023-01-13 DIAGNOSIS — R7989 Other specified abnormal findings of blood chemistry: Secondary | ICD-10-CM | POA: Diagnosis not present

## 2023-01-13 DIAGNOSIS — C678 Malignant neoplasm of overlapping sites of bladder: Secondary | ICD-10-CM | POA: Diagnosis not present

## 2023-01-17 DIAGNOSIS — C7989 Secondary malignant neoplasm of other specified sites: Secondary | ICD-10-CM | POA: Diagnosis not present

## 2023-01-17 DIAGNOSIS — C678 Malignant neoplasm of overlapping sites of bladder: Secondary | ICD-10-CM | POA: Diagnosis not present

## 2023-01-18 DIAGNOSIS — H269 Unspecified cataract: Secondary | ICD-10-CM | POA: Diagnosis not present

## 2023-01-18 DIAGNOSIS — H25811 Combined forms of age-related cataract, right eye: Secondary | ICD-10-CM | POA: Diagnosis not present

## 2023-01-19 DIAGNOSIS — C678 Malignant neoplasm of overlapping sites of bladder: Secondary | ICD-10-CM | POA: Diagnosis not present

## 2023-01-24 DIAGNOSIS — C7989 Secondary malignant neoplasm of other specified sites: Secondary | ICD-10-CM | POA: Diagnosis not present

## 2023-01-24 DIAGNOSIS — C678 Malignant neoplasm of overlapping sites of bladder: Secondary | ICD-10-CM | POA: Diagnosis not present

## 2023-01-25 DIAGNOSIS — C678 Malignant neoplasm of overlapping sites of bladder: Secondary | ICD-10-CM | POA: Diagnosis not present

## 2023-01-25 DIAGNOSIS — C7989 Secondary malignant neoplasm of other specified sites: Secondary | ICD-10-CM | POA: Diagnosis not present

## 2023-01-26 DIAGNOSIS — C7989 Secondary malignant neoplasm of other specified sites: Secondary | ICD-10-CM | POA: Diagnosis not present

## 2023-01-26 DIAGNOSIS — C678 Malignant neoplasm of overlapping sites of bladder: Secondary | ICD-10-CM | POA: Diagnosis not present

## 2023-01-27 DIAGNOSIS — C7989 Secondary malignant neoplasm of other specified sites: Secondary | ICD-10-CM | POA: Diagnosis not present

## 2023-01-27 DIAGNOSIS — C678 Malignant neoplasm of overlapping sites of bladder: Secondary | ICD-10-CM | POA: Diagnosis not present

## 2023-01-28 DIAGNOSIS — C7989 Secondary malignant neoplasm of other specified sites: Secondary | ICD-10-CM | POA: Diagnosis not present

## 2023-01-28 DIAGNOSIS — C678 Malignant neoplasm of overlapping sites of bladder: Secondary | ICD-10-CM | POA: Diagnosis not present

## 2023-01-31 DIAGNOSIS — C678 Malignant neoplasm of overlapping sites of bladder: Secondary | ICD-10-CM | POA: Diagnosis not present

## 2023-01-31 DIAGNOSIS — C7989 Secondary malignant neoplasm of other specified sites: Secondary | ICD-10-CM | POA: Diagnosis not present

## 2023-02-01 DIAGNOSIS — H269 Unspecified cataract: Secondary | ICD-10-CM | POA: Diagnosis not present

## 2023-02-01 DIAGNOSIS — H25812 Combined forms of age-related cataract, left eye: Secondary | ICD-10-CM | POA: Diagnosis not present

## 2023-02-02 DIAGNOSIS — C678 Malignant neoplasm of overlapping sites of bladder: Secondary | ICD-10-CM | POA: Diagnosis not present

## 2023-02-02 DIAGNOSIS — C7989 Secondary malignant neoplasm of other specified sites: Secondary | ICD-10-CM | POA: Diagnosis not present

## 2023-02-03 DIAGNOSIS — C678 Malignant neoplasm of overlapping sites of bladder: Secondary | ICD-10-CM | POA: Diagnosis not present

## 2023-02-03 DIAGNOSIS — C7989 Secondary malignant neoplasm of other specified sites: Secondary | ICD-10-CM | POA: Diagnosis not present

## 2023-02-04 DIAGNOSIS — C7989 Secondary malignant neoplasm of other specified sites: Secondary | ICD-10-CM | POA: Diagnosis not present

## 2023-02-04 DIAGNOSIS — C678 Malignant neoplasm of overlapping sites of bladder: Secondary | ICD-10-CM | POA: Diagnosis not present

## 2023-02-07 DIAGNOSIS — C678 Malignant neoplasm of overlapping sites of bladder: Secondary | ICD-10-CM | POA: Diagnosis not present

## 2023-02-07 DIAGNOSIS — C7989 Secondary malignant neoplasm of other specified sites: Secondary | ICD-10-CM | POA: Diagnosis not present

## 2023-02-08 DIAGNOSIS — C678 Malignant neoplasm of overlapping sites of bladder: Secondary | ICD-10-CM | POA: Diagnosis not present

## 2023-02-08 DIAGNOSIS — C7989 Secondary malignant neoplasm of other specified sites: Secondary | ICD-10-CM | POA: Diagnosis not present

## 2023-02-09 DIAGNOSIS — C678 Malignant neoplasm of overlapping sites of bladder: Secondary | ICD-10-CM | POA: Diagnosis not present

## 2023-02-09 DIAGNOSIS — C7989 Secondary malignant neoplasm of other specified sites: Secondary | ICD-10-CM | POA: Diagnosis not present

## 2023-02-10 DIAGNOSIS — C7989 Secondary malignant neoplasm of other specified sites: Secondary | ICD-10-CM | POA: Diagnosis not present

## 2023-02-10 DIAGNOSIS — C678 Malignant neoplasm of overlapping sites of bladder: Secondary | ICD-10-CM | POA: Diagnosis not present

## 2023-02-11 DIAGNOSIS — C7989 Secondary malignant neoplasm of other specified sites: Secondary | ICD-10-CM | POA: Diagnosis not present

## 2023-02-11 DIAGNOSIS — C678 Malignant neoplasm of overlapping sites of bladder: Secondary | ICD-10-CM | POA: Diagnosis not present

## 2023-02-14 DIAGNOSIS — C7989 Secondary malignant neoplasm of other specified sites: Secondary | ICD-10-CM | POA: Diagnosis not present

## 2023-02-14 DIAGNOSIS — C678 Malignant neoplasm of overlapping sites of bladder: Secondary | ICD-10-CM | POA: Diagnosis not present

## 2023-02-24 DIAGNOSIS — C678 Malignant neoplasm of overlapping sites of bladder: Secondary | ICD-10-CM | POA: Diagnosis not present

## 2023-03-17 DIAGNOSIS — C678 Malignant neoplasm of overlapping sites of bladder: Secondary | ICD-10-CM | POA: Diagnosis not present

## 2023-06-21 ENCOUNTER — Other Ambulatory Visit: Payer: Self-pay

## 2023-11-01 ENCOUNTER — Other Ambulatory Visit: Payer: Self-pay
